# Patient Record
Sex: Male | Born: 1960 | Race: White | Hispanic: No | Marital: Married | Smoking: Never smoker
Health system: Southern US, Community
[De-identification: ages and names within clinical notes are randomized; demographics above are authoritative.]

## PROBLEM LIST (undated history)

## (undated) DIAGNOSIS — I10 Essential (primary) hypertension: Secondary | ICD-10-CM

## (undated) DIAGNOSIS — E785 Hyperlipidemia, unspecified: Secondary | ICD-10-CM

## (undated) DIAGNOSIS — E119 Type 2 diabetes mellitus without complications: Secondary | ICD-10-CM

## (undated) HISTORY — PX: APPENDECTOMY: SHX54

## (undated) HISTORY — DX: Essential (primary) hypertension: I10

## (undated) HISTORY — DX: Hyperlipidemia, unspecified: E78.5

---
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
## 1996-05-18 HISTORY — PX: VASECTOMY: SHX75

## 2016-09-19 DIAGNOSIS — S93401A Sprain of unspecified ligament of right ankle, initial encounter: Secondary | ICD-10-CM | POA: Diagnosis not present

## 2016-12-15 LAB — HEPATIC FUNCTION PANEL
ALT: 17 (ref 10–40)
AST: 14 (ref 14–40)
Alkaline Phosphatase: 87 (ref 25–125)
BILIRUBIN, TOTAL: 0.5

## 2016-12-15 LAB — BASIC METABOLIC PANEL
BUN: 11 (ref 4–21)
CREATININE: 0.8 (ref 0.6–1.3)
Glucose: 114
POTASSIUM: 4.4 (ref 3.4–5.3)
SODIUM: 140 (ref 137–147)

## 2016-12-15 LAB — LIPID PANEL
Cholesterol: 185 (ref 0–200)
HDL: 50 (ref 35–70)
LDL CALC: 107
LDL/HDL RATIO: 3.7
Triglycerides: 141 (ref 40–160)

## 2016-12-15 LAB — VITAMIN D 25 HYDROXY (VIT D DEFICIENCY, FRACTURES): Vit D, 25-Hydroxy: 21.1

## 2016-12-15 LAB — CBC AND DIFFERENTIAL
HEMATOCRIT: 41 (ref 41–53)
HEMOGLOBIN: 13.1 — AB (ref 13.5–17.5)
NEUTROS ABS: 71
PLATELETS: 248 (ref 150–399)
WBC: 6.4

## 2016-12-15 LAB — HEMOGLOBIN A1C: Hemoglobin A1C: 6.5

## 2016-12-15 LAB — PSA: PSA: 0.6

## 2016-12-15 LAB — TSH: TSH: 1.91 (ref 0.41–5.90)

## 2017-02-24 ENCOUNTER — Ambulatory Visit (INDEPENDENT_AMBULATORY_CARE_PROVIDER_SITE_OTHER): Payer: BLUE CROSS/BLUE SHIELD | Admitting: Family Medicine

## 2017-02-24 ENCOUNTER — Encounter: Payer: Self-pay | Admitting: Family Medicine

## 2017-02-24 VITALS — BP 130/92 | HR 72 | Temp 97.8°F | Ht 69.25 in | Wt 316.2 lb

## 2017-02-24 DIAGNOSIS — G4733 Obstructive sleep apnea (adult) (pediatric): Secondary | ICD-10-CM | POA: Diagnosis not present

## 2017-02-24 DIAGNOSIS — Z1211 Encounter for screening for malignant neoplasm of colon: Secondary | ICD-10-CM | POA: Diagnosis not present

## 2017-02-24 DIAGNOSIS — Z7689 Persons encountering health services in other specified circumstances: Secondary | ICD-10-CM | POA: Diagnosis not present

## 2017-02-24 DIAGNOSIS — R7989 Other specified abnormal findings of blood chemistry: Secondary | ICD-10-CM

## 2017-02-24 DIAGNOSIS — Z6841 Body Mass Index (BMI) 40.0 and over, adult: Secondary | ICD-10-CM

## 2017-02-24 DIAGNOSIS — Z9989 Dependence on other enabling machines and devices: Secondary | ICD-10-CM

## 2017-02-24 NOTE — Patient Instructions (Signed)
Follow up in 6 months   Mediterranean Diet A Mediterranean diet refers to food and lifestyle choices that are based on the traditions of countries located on the [employer]. This way of eating has been shown to help prevent certain conditions and improve outcomes for people who have chronic diseases, like kidney disease and heart disease. What are tips for following this plan? Lifestyle  Cook and eat meals together with your family, when possible.  Drink enough fluid to keep your urine clear or pale yellow.  Be physically active every day. This includes: ? Aerobic exercise like running or swimming. ? Leisure activities like gardening, walking, or housework.  Get 7-8 hours of sleep each night.  If recommended by your health care provider, drink red wine in moderation. This means 1 glass a day for nonpregnant women and 2 glasses a day for men. A glass of wine equals 5 oz (150 mL). Reading food labels  Check the serving size of packaged foods. For foods such as rice and pasta, the serving size refers to the amount of cooked product, not dry.  Check the total fat in packaged foods. Avoid foods that have saturated fat or trans fats.  Check the ingredients list for added sugars, such as corn syrup. Shopping  At the grocery store, buy most of your food from the areas near the walls of the store. This includes: ? Fresh fruits and vegetables (produce). ? Grains, beans, nuts, and seeds. Some of these may be available in unpackaged forms or large amounts (in bulk). ? Fresh seafood. ? Poultry and eggs. ? Low-fat dairy products.  Buy whole ingredients instead of prepackaged foods.  Buy fresh fruits and vegetables in-season from local farmers markets.  Buy frozen fruits and vegetables in resealable bags.  If you do not have access to quality fresh seafood, buy precooked frozen shrimp or canned fish, such as tuna, salmon, or sardines.  Buy small amounts of raw or cooked vegetables,  salads, or olives from the deli or salad bar at your store.  Stock your pantry so you always have certain foods on hand, such as olive oil, canned tuna, canned tomatoes, rice, pasta, and beans. Cooking  Cook foods with extra-virgin olive oil instead of using butter or other vegetable oils.  Have meat as a side dish, and have vegetables or grains as your main dish. This means having meat in small portions or adding small amounts of meat to foods like pasta or stew.  Use beans or vegetables instead of meat in common dishes like chili or lasagna.  Experiment with different cooking methods. Try roasting or broiling vegetables instead of steaming or sauteing them.  Add frozen vegetables to soups, stews, pasta, or rice.  Add nuts or seeds for added healthy fat at each meal. You can add these to yogurt, salads, or vegetable dishes.  Marinate fish or vegetables using olive oil, lemon juice, garlic, and fresh herbs. Meal planning  Plan to eat 1 vegetarian meal one day each week. Try to work up to 2 vegetarian meals, if possible.  Eat seafood 2 or more times a week.  Have healthy snacks readily available, such as: ? Vegetable sticks with hummus. ? [place] yogurt. ? Fruit and nut trail mix.  Eat balanced meals throughout the week. This includes: ? Fruit: 2-3 servings a day ? Vegetables: 4-5 servings a day ? Low-fat dairy: 2 servings a day ? Fish, poultry, or lean meat: 1 serving a day ? Beans and legumes: 2  or more servings a week ? Nuts and seeds: 1-2 servings a day ? Whole grains: 6-8 servings a day ? Extra-virgin olive oil: 3-4 servings a day  Limit red meat and sweets to only a few servings a month What are my food choices?  Mediterranean diet ? Recommended ? Grains: Whole-grain pasta. Brown rice. Bulgar wheat. Polenta. Couscous. Whole-wheat bread. [place]. ? Vegetables: Artichokes. Beets. Broccoli. Cabbage. Carrots. Eggplant. Green beans. Chard. Kale. Spinach. Onions.  Leeks. Peas. Squash. Tomatoes. Peppers. Radishes. ? Fruits: Apples. Apricots. Avocado. Berries. Bananas. Cherries. Dates. Figs. Grapes. Lemons. Melon. Oranges. Peaches. Plums. Pomegranate. ? Meats and other protein foods: Beans. Almonds. Sunflower seeds. Pine nuts. Peanuts. Sparks. Salmon. Scallops. Shrimp. [place]. Tilapia. Clams. Oysters. Eggs. ? Dairy: Low-fat milk. Cheese. Greek yogurt. ? Beverages: Water. Red wine. Herbal tea. ? Fats and oils: Extra virgin olive oil. Avocado oil. Grape seed oil. ? Sweets and desserts: [place] yogurt with honey. Baked apples. Poached pears. Trail mix. ? Seasoning and other foods: Basil. Cilantro. Coriander. Cumin. Mint. Parsley. Sage. Rosemary. Tarragon. Garlic. Oregano. Thyme. Pepper. Balsalmic vinegar. Tahini. Hummus. Tomato sauce. Olives. Mushrooms. ? Limit these ? Grains: Prepackaged pasta or rice dishes. Prepackaged cereal with added sugar. ? Vegetables: Deep fried potatoes (french fries). ? Fruits: Fruit canned in syrup. ? Meats and other protein foods: Beef. Pork. Lamb. Poultry with skin. Hot dogs. Berniece Salines. ? Dairy: Ice cream. Sour cream. Whole milk. ? Beverages: Juice. Sugar-sweetened soft drinks. Beer. Liquor and spirits. ? Fats and oils: Butter. Canola oil. Vegetable oil. Beef fat (tallow). Lard. ? Sweets and desserts: Cookies. Cakes. Pies. Candy. ? Seasoning and other foods: Mayonnaise. Premade sauces and marinades. ? The items listed may not be a complete list. Talk with your dietitian about what dietary choices are right for you. Summary  The Mediterranean diet includes both food and lifestyle choices.  Eat a variety of fresh fruits and vegetables, beans, nuts, seeds, and whole grains.  Limit the amount of red meat and sweets that you eat.  Talk with your health care provider about whether it is safe for you to drink red wine in moderation. This means 1 glass a day for nonpregnant women and 2 glasses a day for men. A glass of wine equals 5 oz (150  mL). This information is not intended to replace advice given to you by your health care provider. Make sure you discuss any questions you have with your health care provider. Document Released: 12/26/2015 Document Revised: 01/28/2016 Document Reviewed: 12/26/2015 Elsevier Interactive Patient Education  [employer].

## 2017-02-24 NOTE — Progress Notes (Signed)
Subjective:    Patient ID: Mike Wilson, male    DOB: 12/23/1960, 56 y.o.   MRN: [id number]  HPI This is a 56 yo male who presents today to establish care. He is married (28 years). 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 Has 4 children, 3 at home. Has been remodeling his house over last year. Stress level good. Sleeps 7-8 hours most nights, feels rested.  Had blood work done 2 months ago through work.   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 Little soda, drinks 1/2 and 1/2 tea occasionally, coffee. Lightly sweetened coffee. Weight varies from 270-340. Good water intake. Eats trail mix for snack.   Has had low testosterone and was on testosterone injections. Felt like he was able to lose weight better. Has seen urologist in past.  Uses CPAP. Good compliance. Feels rested.  No past medical history on file. Past Surgical History:  Procedure Laterality Date  . TONSILLECTOMY  1969  . VASECTOMY  1998   Family History  Problem Relation Age of Onset  . Cancer Mother   . Diabetes Mother   . Cancer Father   . Diabetes Sister    Social History  Substance Use Topics  . Smoking status: Never Smoker  . Smokeless tobacco: Never Used  . Alcohol use Yes     Comment: occ      Review of Systems  Constitutional: Negative for fever.  HENT: Negative.   Eyes: Negative.   Respiratory: Negative.   Cardiovascular: Positive for leg swelling (occasional). Negative for chest pain and palpitations.  Gastrointestinal: Negative for abdominal pain, constipation, diarrhea, nausea and vomiting.       Occasional acid indigestion, usually with  tomato based products.   Genitourinary: Negative.   Musculoskeletal:       Knee pain with a lot of activity.   Allergic/Immunologic: Negative.   Neurological: Negative.   Psychiatric/Behavioral: Negative for dysphoric mood and sleep disturbance. The patient is not nervous/anxious.    Depression screen PHQ 2/9 02/24/2017  Decreased Interest 0  Down, Depressed, Hopeless 0  PHQ - 2 Score 0       Objective:   Physical Exam  Constitutional: He is oriented to person, place, and time. He appears well-developed and well-nourished. No distress.  HENT:  Head: Normocephalic and atraumatic.  Cardiovascular: Normal rate, regular rhythm and normal heart sounds.   Pulmonary/Chest: Effort normal and breath sounds normal.  Neurological: He is alert and oriented to person, place, and time.  Skin: Skin is warm and dry. He is not diaphoretic.  Chronic appearing hyperpigmentation of left lower leg.   Psychiatric: He has a normal mood and affect. His behavior is normal. Judgment and thought content normal.  Vitals reviewed.   BP (!) 148/92 (BP Location: Left Arm, Patient Position: Sitting, Cuff Size: Large)   Pulse 72   Temp 97.8 F (36.6 C) (Oral)   Ht 5' 9.25" (1.759 m)   Wt (!) 316 lb 4 oz (143.5 kg)   SpO2 97%   BMI 46.37 kg/m     Assessment & Plan:  1. Encounter to establish care - Discussed and encouraged  healthy lifestyle choices- adequate sleep, regular exercise, stress management and healthy food choices.  - release of records obtained and will obtain recent labs  2. Class 3 severe obesity due to excess calories with serious comorbidity and body mass index (BMI) of 45.0 to 49.9 in adult [facility]) - provided information about Med Diet and encouraged meal planning, watching portions - discussed weight loss and realistic goal of 1/2-1 pound a week  3. OSA on CPAP - continue nightly use  4. Low testosterone in male - Ambulatory referral to Urology  5. Screening for colon cancer -  Ambulatory referral to Gastroenterology   Clarene Reamer, FNP-BC  [facility]  02/24/2017 1:15 PM

## 2017-03-01 LAB — HM DIABETES EYE EXAM

## 2017-03-10 ENCOUNTER — Other Ambulatory Visit: Payer: Self-pay | Admitting: Family Medicine

## 2017-03-10 ENCOUNTER — Encounter: Payer: Self-pay | Admitting: Family Medicine

## 2017-03-10 ENCOUNTER — Telehealth: Payer: Self-pay | Admitting: Family Medicine

## 2017-03-10 MED ORDER — METFORMIN HCL 500 MG PO TABS
ORAL_TABLET | ORAL | 5 refills | Status: DC
Start: 2017-03-10 — End: 2017-12-09

## 2017-03-10 NOTE — Telephone Encounter (Signed)
Called and spoke with pt informing him of results and recommendations. Understanding verbalized nothing further needed at this time. Patient will call and schedule an appointment at his convenience.

## 2017-03-10 NOTE — Telephone Encounter (Signed)
Please call patient and tell him that I received his lab results and his hemoglobin A1C level shows he has diabetes. I have sent in a prescription for a medication to help lower his blood sugar and it will also help him lose weight. I am sending additional information through [facility]. I would like him to come back in 3-4 months for a recheck.

## 2017-03-12 ENCOUNTER — Ambulatory Visit (INDEPENDENT_AMBULATORY_CARE_PROVIDER_SITE_OTHER): Payer: BLUE CROSS/BLUE SHIELD | Admitting: Urology

## 2017-03-12 ENCOUNTER — Encounter: Payer: Self-pay | Admitting: Urology

## 2017-03-12 VITALS — BP 151/90 | HR 76 | Ht 69.25 in | Wt 315.9 lb

## 2017-03-12 DIAGNOSIS — E291 Testicular hypofunction: Secondary | ICD-10-CM | POA: Diagnosis not present

## 2017-03-12 DIAGNOSIS — N401 Enlarged prostate with lower urinary tract symptoms: Secondary | ICD-10-CM | POA: Diagnosis not present

## 2017-03-12 DIAGNOSIS — Z87448 Personal history of other diseases of urinary system: Secondary | ICD-10-CM | POA: Diagnosis not present

## 2017-03-12 NOTE — Progress Notes (Signed)
03/12/2017 9:16 AM   [name] April 22, 1961 [id number]  Referring provider: Elby Beck, [place], [place] [address]  Chief Complaint  Patient presents with  . Hypogonadism    HPI: Mike Wilson is a 56 y.o. male with a prior history of hypogonadism.  He was on testosterone replacement therapy for several years when living in [place].  He last saw his urologist there 3 years ago when he moved to [place].  He lost his insurance and has been off replacement for the last 3 years.  He desires to restart therapy.  His symptoms include tiredness, fatigue, decreased libido, erectile dysfunction and increased weight gain.  He states all of the symptoms were improved on replacement.  He also has an apparent history of urethral stricture disease and His previous records have been requested.  Periodically had either urethral dilation or internal urethrotomy.  He has intermittent lower urinary tract symptoms consisting of urinary frequency and urgency which occur about once per week.  Denies dysuria or gross hematuria.  Denies flank, abdominal, pelvic or scrotal pain.   PMH: History reviewed. No pertinent past medical history.  Surgical History: Past Surgical History:  Procedure Laterality Date  . APPENDECTOMY    . TONSILLECTOMY  1969  . VASECTOMY  1998    Home Medications:  Allergies as of 03/12/2017   No Known Allergies     Medication List       Accurate as of 03/12/17  9:16 AM. Always use your most recent med list.          ibuprofen 600 MG tablet Commonly known as:  ADVIL,MOTRIN Take by mouth.   metFORMIN 500 MG tablet Commonly known as:  GLUCOPHAGE Take one tablet once a day with dinner for 7 days then take one tablet twice a day (breakfast and dinner).       Allergies: No Known Allergies  Family History: Family History  Problem Relation Age of Onset  . Cancer Mother   . Diabetes Mother   . Cancer Father   . Diabetes Sister     . Prostate cancer Neg Hx   . Bladder Cancer Neg Hx   . Kidney cancer Neg Hx     Social History:  reports that he has never smoked. He has never used smokeless tobacco. He reports that he drinks alcohol. He reports that he does not use drugs.  ROS: UROLOGY Frequent Urination?: Yes Hard to postpone urination?: Yes Burning/pain with urination?: No Get up at night to urinate?: No Leakage of urine?: Yes Urine stream starts and stops?: No Trouble starting stream?: No Do you have to strain to urinate?: No Blood in urine?: No Urinary tract infection?: No Sexually transmitted disease?: No Injury to kidneys or bladder?: No Painful intercourse?: No Weak stream?: No Erection problems?: No Penile pain?: No  Gastrointestinal Nausea?: No Vomiting?: No Indigestion/heartburn?: No Diarrhea?: Yes Constipation?: No  Constitutional Fever: No Night sweats?: No Weight loss?: Yes Fatigue?: No  Skin Skin rash/lesions?: No Itching?: No  Eyes Blurred vision?: No Double vision?: No  Ears/Nose/Throat Sore throat?: No Sinus problems?: No  Hematologic/Lymphatic Swollen glands?: No Easy bruising?: No  Cardiovascular Leg swelling?: No Chest pain?: No  Respiratory Cough?: No Shortness of breath?: No  Endocrine Excessive thirst?: No  Musculoskeletal Back pain?: No Joint pain?: No  Neurological Headaches?: No Dizziness?: No  Psychologic Depression?: No Anxiety?: No  Physical Exam: BP (!) 151/90 (BP Location: Right Arm, Patient Position: Sitting, Cuff Size: Large)  Pulse 76   Ht 5' 9.25" (1.759 m)   Wt (!) 315 lb 14.4 oz (143.3 kg)   BMI 46.31 kg/m   Constitutional:  Alert and oriented, No acute distress. HEENT: [place] AT, moist mucus membranes.  Trachea midline, no masses. Cardiovascular: No clubbing, cyanosis, or edema. Respiratory: Normal respiratory effort, no increased work of breathing. GI: Abdomen is soft, nontender, nondistended, no abdominal masses GU: No  CVA tenderness.  Penis without lesions, testes descended bilaterally without masses or tenderness, cord/epididymes palpably normal.  Prostate 35g, unable to palpate upper half secondary to body habitus. Skin: No rashes, bruises or suspicious lesions. Lymph: No cervical or inguinal adenopathy. Neurologic: Grossly intact, no focal deficits, moving all 4 extremities. Psychiatric: Normal mood and affect.   Assessment & Plan:   1. Hypogonadism male He desires to restart testosterone replacement.  He was sent to the lab for testosterone, LH and PSA. Potential side effects of testosterone replacement were discussed including stimulation of benign prostatic growth with lower urinary tract symptoms; erythrocytosis; edema; gynecomastia; worsening sleep apnea; venous thromboembolism; testicular atrophy and infertility. Recent studies suggesting an increased incidence of heart attack and stroke in patients taking testosterone was discussed. He was informed there is conflicting evidence regarding the impact of testosterone therapy on cardiovascular risk. The theoretical risk of growth stimulation of an undetected prostate cancer was also discussed.  He was informed that current evidence does not provide any definitive answers regarding the risks of testosterone therapy on prostate cancer and cardiovascular disease. The need for periodic monitoring of his testosterone level, PSA, hematocrit and DRE was discussed.  If hypogonadism confirmed Rx will be sent to his pharmacy and he will follow-up in 3 months.  - Testosterone - Luteinizing hormone  2. Benign prostatic hyperplasia with lower urinary tract symptoms, symptom details unspecified Mild lower urinary tract symptoms which are not bothersome  - PSA  3. History of urethral stricture Presently asymptomatic.  Previous urology records have been requested.   Return in about 3 months (around 06/12/2017) for Recheck, T level.  Abbie Sons,  [facility] [address], [place], [place] [address] [phone]

## 2017-03-13 LAB — TESTOSTERONE: Testosterone: 223 ng/dL — ABNORMAL LOW (ref 264–916)

## 2017-03-13 LAB — LUTEINIZING HORMONE: LH: 4.1 m[IU]/mL (ref 1.7–8.6)

## 2017-03-13 LAB — PSA: Prostate Specific Ag, Serum: 0.5 ng/mL (ref 0.0–4.0)

## 2017-03-21 ENCOUNTER — Encounter: Payer: Self-pay | Admitting: Family Medicine

## 2017-06-09 ENCOUNTER — Encounter: Payer: Self-pay | Admitting: Urology

## 2017-06-09 ENCOUNTER — Ambulatory Visit: Payer: BLUE CROSS/BLUE SHIELD | Admitting: Urology

## 2017-06-09 VITALS — BP 141/96 | HR 69 | Ht 69.25 in | Wt 318.0 lb

## 2017-06-09 DIAGNOSIS — E291 Testicular hypofunction: Secondary | ICD-10-CM

## 2017-06-09 MED ORDER — "NEEDLE (DISP) 20G X 1-1/2"" MISC": 0 refills | Status: DC

## 2017-06-09 MED ORDER — TESTOSTERONE CYPIONATE 200 MG/ML IM SOLN: 140.0000 mg | INTRAMUSCULAR | 0 refills | Status: DC

## 2017-06-09 NOTE — Progress Notes (Signed)
06/09/2017 8:54 AM   Mike Wilson 08-02-1960 [id number]  Referring provider: Elby Beck, [place], [place] [address]  Chief Complaint  Patient presents with  . Hypogonadism    HPI: 57 year old male presents for follow-up of hypogonadism.  Refer to my previous note of 03/12/2017.  His testosterone level was low at 223.  LH was normal at 4.1.  He desires to restart testosterone replacement.   PMH: History reviewed. No pertinent past medical history.  Surgical History: Past Surgical History:  Procedure Laterality Date  . APPENDECTOMY    . TONSILLECTOMY  1969  . VASECTOMY  1998    Home Medications:  Allergies as of 06/09/2017   No Known Allergies     Medication List        Accurate as of 06/09/17  8:54 AM. Always use your most recent med list.          ibuprofen 600 MG tablet Commonly known as:  ADVIL,MOTRIN Take by mouth.   metFORMIN 500 MG tablet Commonly known as:  GLUCOPHAGE Take one tablet once a day with dinner for 7 days then take one tablet twice a day (breakfast and dinner).       Allergies: No Known Allergies  Family History: Family History  Problem Relation Age of Onset  . Cancer Mother   . Diabetes Mother   . Cancer Father   . Diabetes Sister   . Prostate cancer Neg Hx   . Bladder Cancer Neg Hx   . Kidney cancer Neg Hx     Social History:  reports that  has never smoked. he has never used smokeless tobacco. He reports that he drinks alcohol. He reports that he does not use drugs.  ROS: UROLOGY Frequent Urination?: Yes Hard to postpone urination?: Yes Burning/pain with urination?: No Get up at night to urinate?: No Leakage of urine?: Yes Urine stream starts and stops?: No Trouble starting stream?: No Do you have to strain to urinate?: No Blood in urine?: No Urinary tract infection?: No Sexually transmitted disease?: No Injury to kidneys or bladder?: No Painful intercourse?: No Weak stream?:  No Erection problems?: No Penile pain?: No  Gastrointestinal Nausea?: No Vomiting?: No Indigestion/heartburn?: No Diarrhea?: Yes Constipation?: No  Constitutional Fever: No Night sweats?: No Weight loss?: No Fatigue?: No  Skin Skin rash/lesions?: No Itching?: No  Eyes Blurred vision?: No Double vision?: No  Ears/Nose/Throat Sore throat?: No Sinus problems?: No  Hematologic/Lymphatic Swollen glands?: No Easy bruising?: No  Cardiovascular Leg swelling?: No Chest pain?: No  Respiratory Cough?: No Shortness of breath?: No  Endocrine Excessive thirst?: No  Musculoskeletal Back pain?: No Joint pain?: No  Neurological Headaches?: No Dizziness?: No  Psychologic Depression?: No Anxiety?: No  Physical Exam: BP (!) 141/96 (BP Location: Right Arm, Patient Position: Sitting, Cuff Size: Large)   Pulse 69   Ht 5' 9.25" (1.759 m)   Wt (!) 318 lb (144.2 kg)   BMI 46.62 kg/m   Constitutional:  Alert and oriented, No acute distress. HEENT: [place] AT, moist mucus membranes.  Trachea midline, no masses. Cardiovascular: No clubbing, cyanosis, or edema. Respiratory: Normal respiratory effort, no increased work of breathing. GI: Abdomen is soft, nontender, nondistended, no abdominal masses GU: No CVA tenderness.  Skin: No rashes, bruises or suspicious lesions. Lymph: No cervical or inguinal adenopathy. Neurologic: Grossly intact, no focal deficits, moving all 4 extremities. Psychiatric: Normal mood and affect.  Laboratory Data: Lab Results  Component Value Date   WBC  6.4 12/15/2016   HGB 13.1 (A) 12/15/2016   HCT 41 12/15/2016   PLT 248 12/15/2016    Lab Results  Component Value Date   CREATININE 0.8 12/15/2016    Lab Results  Component Value Date   PSA1 0.5 03/12/2017    Lab Results  Component Value Date   TESTOSTERONE 223 (L) 03/12/2017    Lab Results  Component Value Date   HGBA1C 6.5 12/15/2016     Assessment & Plan:   1. Hypogonadism  male We discussed testosterone replacement including topicals, injections and recent approval of subcutaneous injection.  His wife was giving intramuscular injections and he would like to restart.  Rx was sent to his pharmacy.  Follow-up testosterone level in 6 weeks and follow-up visit in 4 months for monitoring blood work.  Potential side effects of testosterone replacement were discussed including stimulation of benign prostatic growth with lower urinary tract symptoms; erythrocytosis; edema; gynecomastia; worsening sleep apnea; venous thromboembolism; testicular atrophy and infertility. Recent studies suggesting an increased incidence of heart attack and stroke in patients taking testosterone was discussed. He was informed there is conflicting evidence regarding the impact of testosterone therapy on cardiovascular risk. The theoretical risk of growth stimulation of an undetected prostate cancer was also discussed.  He was informed that current evidence does not provide any definitive answers regarding the risks of testosterone therapy on prostate cancer and cardiovascular disease. The need for periodic monitoring of his testosterone level, PSA, hematocrit and DRE was discussed.  - Testosterone   Return in about 4 months (around 10/07/2017).  Abbie Sons, [place] [address] [place], [place] [address] [phone]

## 2017-06-10 ENCOUNTER — Telehealth: Payer: Self-pay

## 2017-06-10 LAB — TESTOSTERONE: TESTOSTERONE: 224 ng/dL — AB (ref 264–916)

## 2017-06-10 NOTE — Telephone Encounter (Signed)
LMOM

## 2017-06-10 NOTE — Telephone Encounter (Signed)
-----   Message from Abbie Sons, MD sent at 06/10/2017  7:49 AM EST ----- Repeat T level low at 224.  Rx has been faxed.  Repeat T level 4-6 weeks

## 2017-06-11 ENCOUNTER — Encounter: Payer: Self-pay | Admitting: *Deleted

## 2017-06-11 NOTE — Telephone Encounter (Signed)
Not able to get in touch with pt. Letter sent and lab appt made.

## 2017-06-21 ENCOUNTER — Other Ambulatory Visit: Payer: Self-pay

## 2017-06-21 ENCOUNTER — Telehealth: Payer: Self-pay | Admitting: Gastroenterology

## 2017-06-21 DIAGNOSIS — Z1211 Encounter for screening for malignant neoplasm of colon: Secondary | ICD-10-CM

## 2017-06-21 NOTE — Telephone Encounter (Signed)
Gastroenterology Pre-Procedure Review  Request Date: 08/06/17 Requesting Physician: Dr. Vicente Males  PATIENT REVIEW QUESTIONS: The patient responded to the following health history questions as indicated:    1. Are you having any GI issues? no 2. Do you have a personal history of Polyps? yes (3 years ago) 3. Do you have a family history of Colon Cancer or Polyps? no 4. Diabetes Mellitus? yes (oral meds metformin) 5. Joint replacements in the past 12 months?no 6. Major health problems in the past 3 months?no 7. Any artificial heart valves, MVP, or defibrillator?no    MEDICATIONS & ALLERGIES:    Patient reports the following regarding taking any anticoagulation/antiplatelet therapy:   Plavix, Coumadin, Eliquis, Xarelto, Lovenox, Pradaxa, Brilinta, or Effient? no Aspirin? no  Patient confirms/reports the following medications:  Current Outpatient Medications  Medication Sig Dispense Refill  . ibuprofen (ADVIL,MOTRIN) 600 MG tablet Take by mouth.    . metFORMIN (GLUCOPHAGE) 500 MG tablet Take one tablet once a day with dinner for 7 days then take one tablet twice a day (breakfast and dinner). 60 tablet 5  . NEEDLE, DISP, 20 G (BD DISP NEEDLES) 20G X 1-1/2" MISC As directed 25 each 0  . testosterone cypionate (DEPOTESTOSTERONE CYPIONATE) 200 MG/ML injection Inject 0.7 mLs (140 mg total) into the muscle once a week. 10 mL 0   No current facility-administered medications for this visit.     Patient confirms/reports the following allergies:  No Known Allergies  No orders of the defined types were placed in this encounter.   AUTHORIZATION INFORMATION Primary Insurance: 1D#: Group #:  Secondary Insurance: 1D#: Group #:  SCHEDULE INFORMATION: Date: 08/06/17 Time: Location:[facility]

## 2017-06-21 NOTE — Telephone Encounter (Signed)
Please call patient to schedule colonoscopy °

## 2017-07-21 ENCOUNTER — Other Ambulatory Visit: Payer: BLUE CROSS/BLUE SHIELD

## 2017-07-21 DIAGNOSIS — E291 Testicular hypofunction: Secondary | ICD-10-CM

## 2017-07-22 LAB — TESTOSTERONE: Testosterone: 426 ng/dL (ref 264–916)

## 2017-08-06 ENCOUNTER — Encounter: Payer: Self-pay | Admitting: *Deleted

## 2017-08-06 ENCOUNTER — Ambulatory Visit
Admission: RE | Admit: 2017-08-06 | Discharge: 2017-08-06 | Disposition: A | Discharge status: Home / Self Care | Payer: BLUE CROSS/BLUE SHIELD | Source: Ambulatory Visit | Attending: Gastroenterology | Admitting: Gastroenterology

## 2017-08-06 ENCOUNTER — Encounter
Admission: RE | Disposition: A | Discharge status: Home / Self Care | Payer: Self-pay | Source: Ambulatory Visit | Attending: Gastroenterology

## 2017-08-06 ENCOUNTER — Ambulatory Visit: Payer: BLUE CROSS/BLUE SHIELD | Admitting: Certified Registered Nurse Anesthetist

## 2017-08-06 DIAGNOSIS — K640 First degree hemorrhoids: Secondary | ICD-10-CM | POA: Insufficient documentation

## 2017-08-06 DIAGNOSIS — Z79899 Other long term (current) drug therapy: Secondary | ICD-10-CM | POA: Insufficient documentation

## 2017-08-06 DIAGNOSIS — Z809 Family history of malignant neoplasm, unspecified: Secondary | ICD-10-CM | POA: Diagnosis not present

## 2017-08-06 DIAGNOSIS — K219 Gastro-esophageal reflux disease without esophagitis: Secondary | ICD-10-CM | POA: Insufficient documentation

## 2017-08-06 DIAGNOSIS — K649 Unspecified hemorrhoids: Secondary | ICD-10-CM | POA: Diagnosis not present

## 2017-08-06 DIAGNOSIS — Z7984 Long term (current) use of oral hypoglycemic drugs: Secondary | ICD-10-CM | POA: Diagnosis not present

## 2017-08-06 DIAGNOSIS — Z86010 Personal history of colonic polyps: Secondary | ICD-10-CM | POA: Insufficient documentation

## 2017-08-06 DIAGNOSIS — Z1211 Encounter for screening for malignant neoplasm of colon: Secondary | ICD-10-CM | POA: Diagnosis not present

## 2017-08-06 DIAGNOSIS — Z6841 Body Mass Index (BMI) 40.0 and over, adult: Secondary | ICD-10-CM | POA: Diagnosis not present

## 2017-08-06 DIAGNOSIS — Z833 Family history of diabetes mellitus: Secondary | ICD-10-CM | POA: Insufficient documentation

## 2017-08-06 DIAGNOSIS — D122 Benign neoplasm of ascending colon: Secondary | ICD-10-CM

## 2017-08-06 DIAGNOSIS — K635 Polyp of colon: Secondary | ICD-10-CM | POA: Diagnosis not present

## 2017-08-06 DIAGNOSIS — D126 Benign neoplasm of colon, unspecified: Secondary | ICD-10-CM | POA: Insufficient documentation

## 2017-08-06 DIAGNOSIS — E119 Type 2 diabetes mellitus without complications: Secondary | ICD-10-CM | POA: Insufficient documentation

## 2017-08-06 HISTORY — DX: Type 2 diabetes mellitus without complications: E11.9

## 2017-08-06 HISTORY — PX: COLONOSCOPY WITH PROPOFOL: SHX5780

## 2017-08-06 LAB — GLUCOSE, CAPILLARY: Glucose-Capillary: 97 mg/dL (ref 65–99)

## 2017-08-06 SURGERY — COLONOSCOPY WITH PROPOFOL
Anesthesia: General

## 2017-08-06 MED ORDER — LIDOCAINE HCL (CARDIAC) 20 MG/ML IV SOLN
INTRAVENOUS | Status: DC | PRN
  Administered 2017-08-06: 50 mg via INTRAVENOUS

## 2017-08-06 MED ORDER — PROPOFOL 10 MG/ML IV BOLUS
INTRAVENOUS | Status: DC | PRN
  Administered 2017-08-06 (×2): 30 mg via INTRAVENOUS
  Administered 2017-08-06: 20 mg via INTRAVENOUS
  Administered 2017-08-06 (×2): 30 mg via INTRAVENOUS
  Administered 2017-08-06: 20 mg via INTRAVENOUS
  Administered 2017-08-06 (×2): 30 mg via INTRAVENOUS
  Administered 2017-08-06: 100 mg via INTRAVENOUS
  Administered 2017-08-06: 20 mg via INTRAVENOUS
  Administered 2017-08-06: 30 mg via INTRAVENOUS
  Administered 2017-08-06: 20 mg via INTRAVENOUS

## 2017-08-06 MED ORDER — PROPOFOL 500 MG/50ML IV EMUL
INTRAVENOUS | Status: AC
  Filled 2017-08-06: qty 50

## 2017-08-06 MED ORDER — LIDOCAINE HCL (PF) 2 % IJ SOLN
INTRAMUSCULAR | Status: AC
  Filled 2017-08-06: qty 10

## 2017-08-06 MED ORDER — SODIUM CHLORIDE 0.9 % IV SOLN
INTRAVENOUS | Status: DC
  Administered 2017-08-06: 09:00:00 via INTRAVENOUS

## 2017-08-06 NOTE — Anesthesia Postprocedure Evaluation (Signed)
Anesthesia Post Note  Patient: Mike Wilson  Procedure(s) Performed: COLONOSCOPY WITH PROPOFOL (N/A )  Patient location during evaluation: Endoscopy Anesthesia Type: General Level of consciousness: awake and alert Pain management: pain level controlled Vital Signs Assessment: post-procedure vital signs reviewed and stable Respiratory status: spontaneous breathing, nonlabored ventilation, respiratory function stable and patient connected to nasal cannula oxygen Cardiovascular status: blood pressure returned to baseline and stable Postop Assessment: no apparent nausea or vomiting Anesthetic complications: no     Last Vitals:  Vitals Value Taken Time  BP    Temp    Pulse    Resp    SpO2      Last Pain:  Vitals:   08/06/17 0920  TempSrc: Tympanic  PainSc:                  [name]

## 2017-08-06 NOTE — Anesthesia Post-op Follow-up Note (Signed)
Anesthesia QCDR form completed.        

## 2017-08-06 NOTE — Op Note (Signed)
[facility] Gastroenterology Patient Name: Mike Wilson Procedure Date: 08/06/2017 8:34 AM MRN: [id number] Account #: [id number] Date of Birth: 04/30/1961 Admit Type: Outpatient Age: 57 Room: [facility] ENDO ROOM 4 Gender: Male Note Status: Finalized Procedure:            Colonoscopy Indications:          High risk colon cancer surveillance: Personal history                        of colonic polyps Providers:            Jonathon Bellows MD, MD Referring MD:         Elby Beck (Referring MD) Medicines:            Monitored Anesthesia Care Complications:        No immediate complications. Procedure:            Pre-Anesthesia Assessment:                       - Prior to the procedure, a History and Physical was                        performed, and patient medications, allergies and                        sensitivities were reviewed. The patient's tolerance of                        previous anesthesia was reviewed.                       - The risks and benefits of the procedure and the                        sedation options and risks were discussed with the                        patient. All questions were answered and informed                        consent was obtained.                       - ASA Grade Assessment: III - A patient with severe                        systemic disease.                       After obtaining informed consent, the colonoscope was                        passed under direct vision. Throughout the procedure,                        the patient's blood pressure, pulse, and oxygen                        saturations were monitored continuously. The                        Colonoscope  was introduced through the anus and                        advanced to the the cecum, identified by the                        appendiceal orifice, IC valve and transillumination.                        The colonoscopy was performed with ease. The patient               tolerated the procedure well. The quality of the bowel                        preparation was good. Findings:      The perianal and digital rectal examinations were normal.      A 3 mm polyp was found in the ascending colon. The polyp was sessile.       The polyp was removed with a cold biopsy forceps. Resection and       retrieval were complete.      Non-bleeding internal hemorrhoids were found during retroflexion. The       hemorrhoids were small and Grade I (internal hemorrhoids that do not       prolapse).      The exam was otherwise without abnormality on direct and retroflexion       views. Impression:           - One 3 mm polyp in the ascending colon, removed with a                        cold biopsy forceps. Resected and retrieved.                       - Non-bleeding internal hemorrhoids.                       - The examination was otherwise normal on direct and                        retroflexion views. Recommendation:       - Discharge patient to home (with escort).                       - Resume previous diet.                       - Continue present medications.                       - Await pathology results.                       - Repeat colonoscopy in 5 years for surveillance. Procedure Code(s):    --- Professional ---                       [id number], Colonoscopy, flexible; with biopsy, single or                        multiple Diagnosis Code(s):    --- Professional ---  D12.2, Benign neoplasm of ascending colon                       Z86.010, Personal history of colonic polyps                       K64.0, First degree hemorrhoids CPT copyright 2016 American Medical Association. All rights reserved. The codes documented in this report are preliminary and upon coder review may  be revised to meet current compliance requirements. Jonathon Bellows, MD Jonathon Bellows MD, MD 08/06/2017 9:21:00 AM This report has been signed electronically. Number of  Addenda: 0 Note Initiated On: 08/06/2017 8:34 AM Scope Withdrawal Time: 0 hours 11 minutes 18 seconds  Total Procedure Duration: 0 hours 14 minutes 27 seconds       [facility]

## 2017-08-06 NOTE — Transfer of Care (Signed)
Immediate Anesthesia Transfer of Care Note  Patient: Mike Wilson  Procedure(s) Performed: COLONOSCOPY WITH PROPOFOL (N/A )  Patient Location: PACU  Anesthesia Type:General  Level of Consciousness: drowsy  Airway & Oxygen Therapy: Patient Spontanous Breathing and Patient connected to nasal cannula oxygen  Post-op Assessment: Report given to RN and Post -op Vital signs reviewed and stable  Post vital signs: Reviewed and stable  Last Vitals:  Vitals Value Taken Time  BP 108/68 08/06/2017  9:24 AM  Temp 35.9 C 08/06/2017  9:20 AM  Pulse 65 08/06/2017  9:25 AM  Resp 15 08/06/2017  9:25 AM  SpO2 96 % 08/06/2017  9:25 AM  Vitals shown include unvalidated device data.  Last Pain:  Vitals:   08/06/17 0920  TempSrc: Tympanic  PainSc:          Complications: No apparent anesthesia complications

## 2017-08-06 NOTE — H&P (Signed)
Jonathon Bellows, MD [address], [place], [place], [place], [address] [address], [place], [place], [place], [address] Phone: [phone]  Fax: [phone]  Primary Care Physician:  Elby Beck, FNP   Pre-Procedure History & Physical: HPI:  Amine Adelson is a 57 y.o. male is here for an colonoscopy.   Past Medical History:  Diagnosis Date  . Diabetes mellitus without complication [facility])     Past Surgical History:  Procedure Laterality Date  . APPENDECTOMY    . TONSILLECTOMY  1969  . VASECTOMY  1998    Prior to Admission medications   Medication Sig Start Date End Date Taking? Authorizing Provider  metFORMIN (GLUCOPHAGE) 500 MG tablet Take one tablet once a day with dinner for 7 days then take one tablet twice a day (breakfast and dinner). 03/10/17  Yes Elby Beck, FNP  testosterone cypionate (DEPOTESTOSTERONE CYPIONATE) 200 MG/ML injection Inject 0.7 mLs (140 mg total) into the muscle once a week. 06/09/17  Yes Stoioff, Ronda Fairly, MD  ibuprofen (ADVIL,MOTRIN) 600 MG tablet Take by mouth.    [provider]  NEEDLE, DISP, 20 G (BD DISP NEEDLES) 20G X 1-1/2" MISC As directed 06/09/17   Abbie Sons, MD    Allergies as of 06/22/2017  . (No Known Allergies)    Family History  Problem Relation Age of Onset  . Cancer Mother   . Diabetes Mother   . Cancer Father   . Diabetes Sister   . Prostate cancer Neg Hx   . Bladder Cancer Neg Hx   . Kidney cancer Neg Hx     Social History   Socioeconomic History  . Marital status: Married    Spouse name: Not on file  . Number of children: Not on file  . Years of education: Not on file  . Highest education level: Not on file  Occupational History  . Not on file  Social Needs  . Financial resource strain: Not on file  . Food insecurity:    Worry: Not on file    Inability: Not on file  . Transportation needs:    Medical: Not on file    Non-medical: Not on file  Tobacco Use  . Smoking status: Never  Smoker  . Smokeless tobacco: Never Used  Substance and Sexual Activity  . Alcohol use: Yes    Comment: occ  . Drug use: No  . Sexual activity: Yes    Partners: Female  Lifestyle  . Physical activity:    Days per week: Not on file    Minutes per session: Not on file  . Stress: Not on file  Relationships  . Social connections:    Talks on phone: Not on file    Gets together: Not on file    Attends religious service: Not on file    Active member of club or organization: Not on file    Attends meetings of clubs or organizations: Not on file    Relationship status: Not on file  . Intimate partner violence:    Fear of current or ex partner: Not on file    Emotionally abused: Not on file    Physically abused: Not on file    Forced sexual activity: Not on file  Other Topics Concern  . Not on file  Social History Narrative  . Not on file    Review of Systems: See HPI, otherwise negative ROS  Physical Exam: BP 120/74   Pulse 60   Temp 98.1 F (  36.7 C) (Tympanic)   Resp 16   Ht 5\' 9"  (1.753 m)   Wt (!) 318 lb (144.2 kg)   SpO2 100%   BMI 46.96 kg/m  General:   Alert,  pleasant and cooperative in NAD Head:  Normocephalic and atraumatic. Neck:  Supple; no masses or thyromegaly. Lungs:  Clear throughout to auscultation, normal respiratory effort.    Heart:  +S1, +S2, Regular rate and rhythm, No edema. Abdomen:  Soft, nontender and nondistended. Normal bowel sounds, without guarding, and without rebound.   Neurologic:  Alert and  oriented x4;  grossly normal neurologically.  Impression/Plan: Jatniel Verastegui is here for an colonoscopy to be performed for surveillance due to prior history of colon polyps   Risks, benefits, limitations, and alternatives regarding  colonoscopy have been reviewed with the patient.  Questions have been answered.  All parties agreeable.   Jonathon Bellows, MD  08/06/2017, 8:35 AM

## 2017-08-06 NOTE — Anesthesia Preprocedure Evaluation (Signed)
Anesthesia Evaluation  Patient identified by MRN, date of birth, ID band Patient awake    Reviewed: Allergy & Precautions, H&P , NPO status , Patient's Chart, lab work & pertinent test results, reviewed documented beta blocker date and time   History of Anesthesia Complications Negative for: history of anesthetic complications  Airway Mallampati: I  TM Distance: >3 FB Neck ROM: full    Dental  (+) Missing, Dental Advidsory Given   Pulmonary neg pulmonary ROS,           Cardiovascular Exercise Tolerance: Good negative cardio ROS       Neuro/Psych negative neurological ROS  negative psych ROS   GI/Hepatic Neg liver ROS, GERD  ,  Endo/Other  diabetes, Type 2, Oral Hypoglycemic AgentsMorbid obesity  Renal/GU negative Renal ROS  negative genitourinary   Musculoskeletal   Abdominal   Peds  Hematology negative hematology ROS (+)   Anesthesia Other Findings Past Medical History: No date: Diabetes mellitus without complication (HCC)   Reproductive/Obstetrics negative OB ROS                             Anesthesia Physical Anesthesia Plan  ASA: III  Anesthesia Plan: General   Post-op Pain Management:    Induction: Intravenous  PONV Risk Score and Plan: 2 and Propofol infusion  Airway Management Planned: Natural Airway and Nasal Cannula  Additional Equipment:   Intra-op Plan:   Post-operative Plan:   Informed Consent: I have reviewed the patients History and Physical, chart, labs and discussed the procedure including the risks, benefits and alternatives for the proposed anesthesia with the patient or authorized representative who has indicated his/her understanding and acceptance.   Dental Advisory Given  Plan Discussed with: Anesthesiologist, CRNA and Surgeon  Anesthesia Plan Comments:         Anesthesia Quick Evaluation

## 2017-08-06 NOTE — Anesthesia Procedure Notes (Signed)
Date/Time: 08/06/2017 9:00 AM Performed by: Rudean Hitt, CRNA Pre-anesthesia Checklist: Patient identified, Emergency Drugs available, Suction available, Patient being monitored and Timeout performed Patient Re-evaluated:Patient Re-evaluated prior to induction Oxygen Delivery Method: Nasal cannula

## 2017-08-09 ENCOUNTER — Encounter: Payer: Self-pay | Admitting: Gastroenterology

## 2017-08-09 LAB — SURGICAL PATHOLOGY

## 2017-08-15 ENCOUNTER — Encounter: Payer: Self-pay | Admitting: Gastroenterology

## 2017-08-16 ENCOUNTER — Encounter: Payer: Self-pay | Admitting: Family Medicine

## 2017-08-18 ENCOUNTER — Telehealth: Payer: Self-pay

## 2017-08-18 NOTE — Telephone Encounter (Signed)
Please call patient and let him know that I am happy to order hemoglobin a1c at next visit.

## 2017-08-18 NOTE — Telephone Encounter (Signed)
Copied from [place]. Topic: Inquiry >> Aug 18, 2017  1:33 PM Pricilla Handler wrote: Reason for CRM: Patient wants to know if [employer] would order a A1C Testing during patient's upcoming appt. Patient stated that he needs this test for his job. Patient would like [employer] to call him at [phone].       Thank You!!!

## 2017-08-18 NOTE — Telephone Encounter (Signed)
Lm on pts vm and advised per [employer]

## 2017-08-18 NOTE — Telephone Encounter (Signed)
Pt has 6 mth f/u 30' appt on 08/25/17 at 8 AM with Glenda Chroman FNP. pts last A1c was 6.5 done on 12/15/16.

## 2017-08-25 ENCOUNTER — Encounter: Payer: Self-pay | Admitting: Family Medicine

## 2017-08-25 ENCOUNTER — Ambulatory Visit: Payer: BLUE CROSS/BLUE SHIELD | Admitting: Family Medicine

## 2017-08-25 VITALS — BP 122/70 | HR 64 | Temp 97.8°F | Wt 302.8 lb

## 2017-08-25 DIAGNOSIS — Z9989 Dependence on other enabling machines and devices: Secondary | ICD-10-CM

## 2017-08-25 DIAGNOSIS — G8929 Other chronic pain: Secondary | ICD-10-CM | POA: Diagnosis not present

## 2017-08-25 DIAGNOSIS — M25561 Pain in right knee: Secondary | ICD-10-CM | POA: Diagnosis not present

## 2017-08-25 DIAGNOSIS — E119 Type 2 diabetes mellitus without complications: Secondary | ICD-10-CM

## 2017-08-25 DIAGNOSIS — Z6841 Body Mass Index (BMI) 40.0 and over, adult: Secondary | ICD-10-CM | POA: Diagnosis not present

## 2017-08-25 DIAGNOSIS — G4733 Obstructive sleep apnea (adult) (pediatric): Secondary | ICD-10-CM | POA: Diagnosis not present

## 2017-08-25 DIAGNOSIS — E785 Hyperlipidemia, unspecified: Secondary | ICD-10-CM

## 2017-08-25 DIAGNOSIS — E1169 Type 2 diabetes mellitus with other specified complication: Secondary | ICD-10-CM

## 2017-08-25 DIAGNOSIS — M720 Palmar fascial fibromatosis [Dupuytren]: Secondary | ICD-10-CM | POA: Diagnosis not present

## 2017-08-25 LAB — LIPID PANEL
CHOLESTEROL: 176 mg/dL (ref 0–200)
HDL: 45.8 mg/dL (ref >39.00)
LDL Cholesterol: 109 mg/dL — ABNORMAL HIGH (ref 0–99)
NonHDL: 130.17
Total CHOL/HDL Ratio: 4
Triglycerides: 105 mg/dL (ref 0.0–149.0)
VLDL: 21 mg/dL (ref 0.0–40.0)

## 2017-08-25 LAB — MICROALBUMIN / CREATININE URINE RATIO
Creatinine,U: 135.7 mg/dL
Microalb Creat Ratio: 0.5 mg/g (ref 0.0–30.0)

## 2017-08-25 LAB — POCT GLYCOSYLATED HEMOGLOBIN (HGB A1C): HEMOGLOBIN A1C: 6

## 2017-08-25 LAB — GLUCOSE, POCT (MANUAL RESULT ENTRY): POC Glucose: 116 mg/dl — AB (ref 70–99)

## 2017-08-25 NOTE — Patient Instructions (Addendum)
Please schedule an appointment with Dr. Lorelei Pont for your knee  Follow up in 6 months for CPE Dupuytren Contracture Dupuytren contracture is a condition in which tissue under the skin of the palm becomes abnormally thickened. This causes one or more of the fingers to curl inward (contract) toward the palm. Eventually, the fingers may not be able to straighten out. This condition affects some or all of the fingers and the palm of the hand. It is often passed along from parent to child (inherited). Dupuytren contracture is a long-term (chronic) condition that develops (progresses) slowly over time. There is no cure, but symptoms can be managed and progression can be slowed with treatment. This condition is usually not dangerous or painful, but it can interfere with everyday tasks. What are the causes? This condition is caused by tissue (fascia) in the palm getting thicker and tighter. When the fascia thickens, it pulls on the cords of tissue (tendons) that control finger movement. This causes the fingers to contract. The cause of fascia thickening is not known. What increases the risk? This condition may be more likely to develop in:  People who are age 51 or older.  Men.  People with a family history of this condition.  People who use tobacco products, including cigarettes, chewing tobacco, and e-cigarettes.  People who drink alcohol excessively.  People with diabetes.  People with autoimmune diseases, such as HIV.  People with seizure disorders.  What are the signs or symptoms? Symptoms may develop in one or both hands. Any of the fingers can contract. The fingers farthest from the thumb are commonly affected. Usually, this condition is painless. You may have discomfort when holding or grabbing objects. Early symptoms of this condition may include:  Thick, puckered skin on the hand.  One or more lumps (nodules) on the palm. Nodules may be tender when they first appear, but they are  generally painless.  Symptoms of this condition develop slowly over months or years. Later symptoms of this condition may include:  Thick cords of tissue in the palm.  Fingers curled up toward the palm.  Inability to straighten the fingers into their normal position.  How is this diagnosed? This condition is diagnosed with a physical exam, which may include:  Looking at your hands and feeling your hands. This is to check for thickened fascia and nodules.  Measuring finger motion.  Doing the [employer]. You may be asked to try to put your hand on a surface, with your palm down and your fingers straight out.  How is this treated? There is no cure for this condition, but treatment can make symptoms more manageable and relieve discomfort. Treatment options may include:  Physical therapy. This can strengthen your hand and increase flexibility.  Occupational therapy. This can help you with everyday tasks that may be more difficult because of your condition.  A hand splint.  Shots (injections). Substances may be injected into your hand, such as: ? Medicines that help to decrease swelling (corticosteroids). ? Proteins (collagenase) to weaken thick tissue. After a collagenase injection, your health care provider may stretch your fingers.  Needle aponeurotomy. In this procedure, a needle is pushed through the skin and into the fascia. Moving the needle against the fascia can weaken or break up the thick tissue.  Surgery. This may be needed if your condition causes discomfort or interferes with everyday activities. Physical therapy is usually needed after surgery.  In some cases, symptoms never develop to the point of  needing major treatment, and caring for yourself at home can be enough to manage your condition. Symptoms often return after treatment. Follow these instructions at home: If you have a splint:  Do not put pressure on any part of the splint until it is fully  hardened. This may take several hours.  Wear the splint as told by your health care provider. Remove it only as told by your health care provider.  Loosen the splint if your fingers tingle, become numb, or turn cold and blue.  Do not let your splint get wet if it is not waterproof. ? If your splint is not waterproof, cover it with a watertight covering when you take a bath or a shower. ? Do not take baths, swim, or use a hot tub until your health care provider approves. Ask your health care provider if you can take showers. You may only be allowed to take sponge baths for bathing.  Keep the splint clean.  Ask your health care provider when it is safe to drive. Hand Care  Take these actions to help protect your hand from possible injury: ? Use tools that have padded grips. ? Wear protective gloves while you work with your hands. ? Avoid repetitive hand movements.  Avoid actions that cause pain or discomfort.  Stretch your hand by gently pulling your fingers backward toward your wrist. Do this as often as is comfortable. Stop if this causes pain.  Gently massage your hand as often as is comfortable.  If directed, apply heat to the affected area as often as told by your health care provider. Use the heat source that your health care provider recommends, such as a moist heat pack or a heating pad. ? Place a towel between your skin and the heat source. ? Leave the heat on for 20-30 minutes. ? Remove the heat if your skin turns bright red. This is especially important if you are unable to feel pain, heat, or cold. You may have a greater risk of getting burned. General instructions  Take over-the-counter and prescription medicines only as told by your health care provider.  Manage any other conditions that you have, such as diabetes.  If physical therapy was prescribed, do exercises as told by your health care provider.  Keep all follow-up visits as told by your health care provider.  This is important. Contact a health care provider if:  You develop new symptoms, or your symptoms get worse.  You have pain that gets worse or does not get better with medicine.  You have difficulty or discomfort with everyday tasks.  You have problems with your splint.  You develop numbness or tingling. Get help right away if:  You have severe pain.  Your fingers change color or become unusually cold. This information is not intended to replace advice given to you by your health care provider. Make sure you discuss any questions you have with your health care provider. Document Released: 03/01/2009 Document Revised: 06/18/2015 Document Reviewed: 09/26/2014 Elsevier Interactive Patient Education  [employer].

## 2017-08-25 NOTE — Progress Notes (Signed)
Subjective:    Patient ID: Mike Wilson, male    DOB: 11-07-1960, 57 y.o.   MRN: [id number]  HPI This is a 57 yo male who presents today for follow up of elevated blood sugar, obesity and sleep apnea.  He has been doing well. Staying really busy renovating his house (almost finished) and just started working on his daughter's house.   Right knee pain- has been hurting for a couple of months. No falls. Pain as day goes on, sometimes feels like it is going to give out. No pain with driving. Has been taking Relief Factor for several weeks and thinks it has been helping. Several years ago went to PT and had injection of knee with good results.   DM/obesity- has lost 16 pounds in last 3 months. Tolerating metformin.   OSA- machine not working well, over 37 years old, he requests referral to specialist for possible retesting and equipment.   Left hand callous- has noticed over several months, palmer surface, not painful, no loss of function of hand.   Past Medical History:  Diagnosis Date  . Diabetes mellitus without complication [facility])    Past Surgical History:  Procedure Laterality Date  . APPENDECTOMY    . COLONOSCOPY WITH PROPOFOL N/A 08/06/2017   Procedure: COLONOSCOPY WITH PROPOFOL;  Surgeon: Jonathon Bellows, MD;  Location: [facility] ENDOSCOPY;  Service: Gastroenterology;  Laterality: N/A;  . TONSILLECTOMY  1969  . VASECTOMY  1998   Family History  Problem Relation Age of Onset  . Cancer Mother   . Diabetes Mother   . Cancer Father   . Diabetes Sister   . Prostate cancer Neg Hx   . Bladder Cancer Neg Hx   . Kidney cancer Neg Hx    Social History   Tobacco Use  . Smoking status: Never Smoker  . Smokeless tobacco: Never Used  Substance Use Topics  . Alcohol use: Yes    Comment: occ  . Drug use: No      Review of Systems Per HPI    Objective:   Physical Exam  Constitutional: He is oriented to person, place, and time. He appears well-developed and well-nourished. No distress.   Obese.   HENT:  Head: Normocephalic and atraumatic.  Eyes: Conjunctivae are normal.  Cardiovascular: Normal rate.  Pulmonary/Chest: Effort normal.  Musculoskeletal:       Left hand: He exhibits deformity (palmar surface with palpable nodules. No pain. ). He exhibits normal range of motion and no tenderness.  Neurological: He is alert and oriented to person, place, and time.  Skin: Skin is warm and dry. He is not diaphoretic.  Psychiatric: He has a normal mood and affect. His behavior is normal. Judgment and thought content normal.  Vitals reviewed.     BP 122/70   Pulse 64   Temp 97.8 F (36.6 C) (Oral)   Wt (!) 302 lb 12 oz (137.3 kg)   SpO2 97%   BMI 44.71 kg/m  Wt Readings from Last 3 Encounters:  08/25/17 (!) 302 lb 12 oz (137.3 kg)  08/06/17 (!) 318 lb (144.2 kg)  06/09/17 (!) 318 lb (144.2 kg)   POCT hemoglobin A1C- 6.0 Fasting POCT glucose- 111    Assessment & Plan:  1. Controlled type 2 diabetes mellitus without complication, without long-term current use of insulin (HCC) - improved hemoglobin A1C - Lipid Panel - Microalbumin / creatinine urine ratio - POCT glucose (manual entry) - HgB A1c  2. Class 3 severe obesity due to excess  calories with serious comorbidity and body mass index (BMI) of 45.0 to 49.9 in adult [facility]) - encouraged continued weight loss - Lipid Panel  3. OSA on CPAP - Ambulatory referral to Pulmonology  4. Chronic pain of right knee - discussed heat/ice, bracing, suggested follow up with Dr. Lorelei Pont  5. Dupuytren's disease of palm of left hand - currently without pain, provided written and verbal information about diagnosis. Follow up with hand surgeon if pain or worsening symptoms develop.   - follow up in 6 months  Clarene Reamer, FNP-BC  [facility]  08/25/2017 2:06 PM

## 2017-08-27 MED ORDER — PRAVASTATIN SODIUM 20 MG PO TABS: 20.0000 mg | ORAL_TABLET | Freq: Every day | ORAL | 3 refills | Status: DC

## 2017-08-27 NOTE — Addendum Note (Signed)
Addended by: Clarene Reamer B on: 08/27/2017 01:57 PM   Modules accepted: Orders

## 2017-09-02 ENCOUNTER — Ambulatory Visit (INDEPENDENT_AMBULATORY_CARE_PROVIDER_SITE_OTHER): Payer: BLUE CROSS/BLUE SHIELD | Admitting: Internal Medicine

## 2017-09-02 ENCOUNTER — Encounter: Payer: Self-pay | Admitting: Internal Medicine

## 2017-09-02 VITALS — BP 126/86 | HR 42 | Resp 16 | Ht 72.0 in | Wt 299.0 lb

## 2017-09-02 DIAGNOSIS — G4733 Obstructive sleep apnea (adult) (pediatric): Secondary | ICD-10-CM

## 2017-09-02 NOTE — Patient Instructions (Signed)
Will need to obtain results from your last sleep study and will send you for a new sleep study.     Sleep Apnea       Sleep apnea is disorder that affects a person's sleep. A person with sleep apnea has abnormal pauses in their breathing when they sleep. It is hard for them to get a good sleep. This makes a person tired during the day. It also can lead to other physical problems. There are three types of sleep apnea. One type is when breathing stops for a short time because your airway is blocked (obstructive sleep apnea). Another type is when the brain sometimes fails to give the normal signal to breathe to the muscles that control your breathing (central sleep apnea). The third type is a combination of the other two types. HOME CARE   Take all medicine as told by your doctor.  Avoid alcohol, calming medicines (sedatives), and depressant drugs.  Try to lose weight if you are overweight. Talk to your doctor about a healthy weight goal.  Your doctor may have you use a device that helps to open your airway. It can help you get the air that you need. It is called a positive airway pressure (PAP) device.   MAKE SURE YOU:   Understand these instructions.  Will watch your condition.  Will get help right away if you are not doing well or get worse.  It may take approximately 1 month for you to get used to wearing her CPAP every night.  Be sure to work with your machine to get used to it, be patient, it may take time!

## 2017-09-02 NOTE — Progress Notes (Signed)
Mike Mike      Assessment and Plan:  Obstructive sleep apnea. -Patient is doing well with his CPAP, however notes that his CPAP is now making loud noises and is malfunctioning. -Unable to perform a download on his machine as card reader says "no data". -In order to qualify for a new machine we will send the patient for a new sleep study in order a new machine as per insurance requirements.  Morbid obesity. -Discussed that weight loss will be beneficial for his health, he is currently losing weight and is planning on continuing.  Diabetes mellitus. - Sleep apnea can contribute to elevated blood glucose, therefore control of sleep apnea is an important part of diabetes management.   Date: 09/02/2017  MRN# [id number] Mike Mike [birth year]    Mike Mike is a 57 y.o. old male seen in Mike for chief complaint of:    Chief Complaint  Patient presents with  . Consult    Referred by Clarene Reamer for OSA managment  . Sleep Apnea    pt needs new cpap machine    HPI:   Mike Mike) is a 57 yo male with a history of OSA several years ago, about 10 yrs ago when they lived in [place].  He has been doing well on CPAP, but recently his machine is not working well, he has issues with his mask. 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 He has a history of tonsillectomy.  No sleep walking, or cataplexy. He denies jaw pain, or TMJ.   PMHX:   Past Medical History:  Diagnosis Date  . Diabetes mellitus without complication [facility])    Surgical Hx:  Past Surgical History:  Procedure Laterality Date  . APPENDECTOMY    . COLONOSCOPY WITH PROPOFOL N/A 08/06/2017   Procedure: COLONOSCOPY WITH PROPOFOL;  Surgeon: Jonathon Bellows, MD;  Location: [facility] ENDOSCOPY;  Service: Gastroenterology;  Laterality: N/A;  . TONSILLECTOMY  1969  .  VASECTOMY  1998   Family Hx:  Family History  Problem Relation Age of Onset  . Cancer Mother   . Diabetes Mother   . Cancer Father   . Diabetes Sister   . Prostate cancer Neg Hx   . Bladder Cancer Neg Hx   . Kidney cancer Neg Hx    Social Hx:   Social History   Tobacco Use  . Smoking status: Never Smoker  . Smokeless tobacco: Never Used  Substance Use Topics  . Alcohol use: Yes    Comment: occ  . Drug use: No   Medication:    Current Outpatient Medications:  .  ibuprofen (ADVIL,MOTRIN) 600 MG tablet, Take by mouth., Disp: , Rfl:  .  metFORMIN (GLUCOPHAGE) 500 MG tablet, Take one tablet once a day with dinner for 7 days then take one tablet twice a day (breakfast and dinner)., Disp: 60 tablet, Rfl: 5 .  NEEDLE, DISP, 20 G (BD DISP NEEDLES) 20G X 1-1/2" MISC, As directed, Disp: 25 each, Rfl: 0 .  pravastatin (PRAVACHOL) 20 MG tablet, Take 1 tablet (20 mg total) by mouth daily., Disp: 90 tablet, Rfl: 3 .  testosterone cypionate (DEPOTESTOSTERONE CYPIONATE) 200 MG/ML injection, Inject 0.7 mLs (140 mg total) into the muscle once a week., Disp: 10 mL, Rfl: 0   Allergies:  Patient has no known allergies.  Review  of Systems: Gen:  Denies  fever, sweats, chills HEENT: Denies blurred vision, double vision. bleeds, sore throat Cvc:  No dizziness, chest pain. Resp:   Denies cough or sputum production, shortness of breath Gi: Denies swallowing difficulty, stomach pain. Gu:  Denies bladder incontinence, burning urine Ext:   No Joint pain, stiffness. Skin: No skin rash,  hives  Endoc:  No polyuria, polydipsia. Psych: No depression, insomnia. Other:  All other systems were reviewed with the patient and were negative other that what is mentioned in the HPI.   Physical Examination:   VS: BP 126/86 (BP Location: Left Arm, Cuff Size: Large)   Pulse (!) 42   Resp 16   Ht 6' (1.829 m)   Wt 299 lb (135.6 kg)   SpO2 97%   BMI 40.55 kg/m   General Appearance: No distress,  obese. Neuro:without focal findings,  speech normal,  HEENT: PERRLA, EOM intact.   Pulmonary: normal breath sounds, No wheezing.  CardiovascularNormal S1,S2.  No m/r/g.   Abdomen: Benign, Soft, non-tender. Renal:  No costovertebral tenderness  GU:  No performed at this time. Endoc: No evident thyromegaly, no signs of acromegaly. Skin:   warm, no rashes, no ecchymosis  Extremities: normal, no cyanosis, clubbing.  Other findings:    LABORATORY PANEL:   CBC No results for input(s): WBC, HGB, HCT, PLT in the last 168 hours. ------------------------------------------------------------------------------------------------------------------  Chemistries  No results for input(s): NA, K, CL, CO2, GLUCOSE, BUN, CREATININE, CALCIUM, MG, AST, ALT, ALKPHOS, BILITOT in the last 168 hours.  Invalid input(s): GFRCGP ------------------------------------------------------------------------------------------------------------------  Cardiac Enzymes No results for input(s): TROPONINI in the last 168 hours. ------------------------------------------------------------  RADIOLOGY:  No results found.     Thank  you for the Mike and for allowing [facility] to assist in the care of your patient. Our recommendations are noted above.  Please contact us if we can be of further service.   Marda Stalker, MD.  Board Certified in Internal Medicine, Pulmonary Medicine, [place], and Sleep Medicine.  [facility] Office Number: [phone]  Patricia Pesa, M.D.  Merton Border, M.D  09/02/2017

## 2017-09-08 ENCOUNTER — Other Ambulatory Visit: Payer: Self-pay

## 2017-10-13 ENCOUNTER — Ambulatory Visit: Payer: BLUE CROSS/BLUE SHIELD | Admitting: Urology

## 2017-10-13 ENCOUNTER — Encounter: Payer: Self-pay | Admitting: Urology

## 2017-10-13 VITALS — BP 123/74 | HR 74 | Resp 16 | Ht 69.0 in | Wt 302.4 lb

## 2017-10-13 DIAGNOSIS — E291 Testicular hypofunction: Secondary | ICD-10-CM

## 2017-10-13 NOTE — Progress Notes (Signed)
10/13/2017 9:52 AM   [name] 01-Aug-1960 [id number]  Referring provider: Elby Beck, [place], [place] [address]  Chief Complaint  Patient presents with  . Follow-up  . Hypogonadism    HPI: Mike Wilson is a 57 year old male who presents for follow-up of hypogonadism.  He was restarted on TRT in October 2018.  He is currently injecting 140 mg every 7-12 days.  He has good energy level and libido.  He denies bothersome lower urinary tract symptoms, breast tenderness/enlargement or lower extremity edema.   PMH: Past Medical History:  Diagnosis Date  . Diabetes mellitus without complication [facility])     Surgical History: Past Surgical History:  Procedure Laterality Date  . APPENDECTOMY    . COLONOSCOPY WITH PROPOFOL N/A 08/06/2017   Procedure: COLONOSCOPY WITH PROPOFOL;  Surgeon: Jonathon Bellows, MD;  Location: [facility] ENDOSCOPY;  Service: Gastroenterology;  Laterality: N/A;  . TONSILLECTOMY  1969  . VASECTOMY  1998    Home Medications:  Allergies as of 10/13/2017   No Known Allergies     Medication List        Accurate as of 10/13/17  9:52 AM. Always use your most recent med list.          ibuprofen 600 MG tablet Commonly known as:  ADVIL,MOTRIN Take by mouth.   metFORMIN 500 MG tablet Commonly known as:  GLUCOPHAGE Take one tablet once a day with dinner for 7 days then take one tablet twice a day (breakfast and dinner).   NEEDLE (DISP) 20 G 20G X 1-1/2" Misc Commonly known as:  BD DISP NEEDLES As directed   pravastatin 20 MG tablet Commonly known as:  PRAVACHOL Take 1 tablet (20 mg total) by mouth daily.   testosterone cypionate 200 MG/ML injection Commonly known as:  DEPOTESTOSTERONE CYPIONATE Inject 0.7 mLs (140 mg total) into the muscle once a week.       Allergies: No Known Allergies  Family History: Family History  Problem Relation Age of Onset  . Cancer Mother   . Diabetes Mother   . Cancer Father   . Diabetes  Sister   . Prostate cancer Neg Hx   . Bladder Cancer Neg Hx   . Kidney cancer Neg Hx     Social History:  reports that he has never smoked. He has never used smokeless tobacco. He reports that he drinks alcohol. He reports that he does not use drugs.  ROS: UROLOGY Frequent Urination?: No Hard to postpone urination?: No Burning/pain with urination?: No Get up at night to urinate?: No Leakage of urine?: No Urine stream starts and stops?: No Trouble starting stream?: No Do you have to strain to urinate?: No Blood in urine?: No Urinary tract infection?: No Sexually transmitted disease?: No Injury to kidneys or bladder?: No Painful intercourse?: No Weak stream?: No Erection problems?: No Penile pain?: No  Gastrointestinal Nausea?: No Vomiting?: No Indigestion/heartburn?: No Diarrhea?: No Constipation?: No  Constitutional Fever: No Night sweats?: No Weight loss?: No Fatigue?: No  Skin Skin rash/lesions?: No Itching?: No  Eyes Blurred vision?: No Double vision?: No  Ears/Nose/Throat Sore throat?: No Sinus problems?: No  Hematologic/Lymphatic Swollen glands?: No Easy bruising?: No  Cardiovascular Leg swelling?: No Chest pain?: No  Respiratory Cough?: No Shortness of breath?: No  Endocrine Excessive thirst?: No  Musculoskeletal Back pain?: No Joint pain?: No  Neurological Headaches?: No Dizziness?: No  Psychologic Depression?: No Anxiety?: No  Physical Exam: BP 123/74   Pulse 74  Resp 16   Ht 5\' 9"  (1.753 m)   Wt (!) 302 lb 6.4 oz (137.2 kg)   SpO2 96%   BMI 44.66 kg/m   Constitutional:  Alert and oriented, No acute distress. HEENT: [place] AT, moist mucus membranes.  Trachea midline, no masses. Cardiovascular: No clubbing, cyanosis, or edema. Respiratory: Normal respiratory effort, no increased work of breathing. GI: Abdomen is soft, nontender, nondistended, no abdominal masses GU: No CVA tenderness.  Prostate 35 g, smooth without  nodules. Lymph: No cervical or inguinal lymphadenopathy. Skin: No rashes, bruises or suspicious lesions. Neurologic: Grossly intact, no focal deficits, moving all 4 extremities. Psychiatric: Normal mood and affect.  Laboratory Data: Lab Results  Component Value Date   WBC 6.4 12/15/2016   HGB 13.1 (A) 12/15/2016   HCT 41 12/15/2016   PLT 248 12/15/2016    Lab Results  Component Value Date   CREATININE 0.8 12/15/2016    Lab Results  Component Value Date   PSA 0.6 12/15/2016    Lab Results  Component Value Date   TESTOSTERONE 426 07/21/2017    Lab Results  Component Value Date   HGBA1C 6.0 08/25/2017     Assessment & Plan:   57 year old male doing well on TRT.  Monitoring blood work was ordered today including PSA, testosterone and hematocrit.  If no significant abnormalities he will follow-up in 6 months for a lab visit in 1 year for labs/exam.   Abbie Sons, MD  [place] [address], [place], [place] [address] [phone]

## 2017-10-14 LAB — HEMATOCRIT: Hematocrit: 42.5 % (ref 37.5–51.0)

## 2017-10-14 LAB — TESTOSTERONE: Testosterone: 236 ng/dL — ABNORMAL LOW (ref 264–916)

## 2017-10-14 LAB — PSA: Prostate Specific Ag, Serum: 0.5 ng/mL (ref 0.0–4.0)

## 2017-10-15 ENCOUNTER — Telehealth: Payer: Self-pay

## 2017-10-15 NOTE — Telephone Encounter (Signed)
Pt informed, he states he will start doing weekly injections.

## 2017-10-15 NOTE — Telephone Encounter (Signed)
lmom for pt to call office

## 2017-10-15 NOTE — Telephone Encounter (Signed)
-----   Message from Abbie Sons, MD sent at 10/14/2017  9:38 AM EDT ----- PSA and hct look good- t level low at 236. Would do injections weekly instead of every 7-12 days unless his sxs are not bothersome.

## 2017-10-18 ENCOUNTER — Encounter: Payer: Self-pay | Admitting: Internal Medicine

## 2017-10-18 DIAGNOSIS — G4733 Obstructive sleep apnea (adult) (pediatric): Secondary | ICD-10-CM | POA: Diagnosis not present

## 2017-10-19 DIAGNOSIS — G4733 Obstructive sleep apnea (adult) (pediatric): Secondary | ICD-10-CM | POA: Diagnosis not present

## 2017-10-20 ENCOUNTER — Telehealth: Payer: Self-pay | Admitting: *Deleted

## 2017-10-20 DIAGNOSIS — G4733 Obstructive sleep apnea (adult) (pediatric): Secondary | ICD-10-CM

## 2017-10-20 NOTE — Telephone Encounter (Signed)
Pt informed of results. States he has a CPAP now and needs a new machine with a local company. Order placed. Nothing further needed.

## 2017-10-27 DIAGNOSIS — G4733 Obstructive sleep apnea (adult) (pediatric): Secondary | ICD-10-CM | POA: Diagnosis not present

## 2017-11-26 DIAGNOSIS — G4733 Obstructive sleep apnea (adult) (pediatric): Secondary | ICD-10-CM | POA: Diagnosis not present

## 2017-12-01 NOTE — Progress Notes (Deleted)
  [facility] Consultation      Assessment and Plan:  Obstructive sleep apnea. -HST reconfirmed presence of severe OSA with AHI of 35.   Morbid obesity. -Discussed that weight loss will be beneficial for his health, he is currently losing weight and is planning on continuing.  Diabetes mellitus. - Sleep apnea can contribute to elevated blood glucose, therefore control of sleep apnea is an important part of diabetes management.   Date: 12/01/2017  MRN# [id number] Mike Wilson Oct 30, 1960    Mike Wilson is a 57 y.o. old male seen in consultation for chief complaint of:    No chief complaint on file.   HPI:   Mr. [name]) is a 57 yo male with a history of OSA several years ago, about 10 yrs ago when they lived in [place].  He has been doing well on CPAP, but recently his machine is not working well, he has issues with his mask. 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 He has a history of tonsillectomy.  No sleep walking, or cataplexy. He denies jaw pain, or TMJ.   **HST 10/18/17>> Severe OSA with AHI of 35.  Medication:    Current Outpatient Medications:  .  ibuprofen (ADVIL,MOTRIN) 600 MG tablet, Take by mouth., Disp: , Rfl:  .  metFORMIN (GLUCOPHAGE) 500 MG tablet, Take one tablet once a day with dinner for 7 days then take one tablet twice a day (breakfast and dinner)., Disp: 60 tablet, Rfl: 5 .  NEEDLE, DISP, 20 G (BD DISP NEEDLES) 20G X 1-1/2" MISC, As directed, Disp: 25 each, Rfl: 0 .  pravastatin (PRAVACHOL) 20 MG tablet, Take 1 tablet (20 mg total) by mouth daily., Disp: 90 tablet, Rfl: 3 .  testosterone cypionate (DEPOTESTOSTERONE CYPIONATE) 200 MG/ML injection, Inject 0.7 mLs (140 mg total) into the muscle once a week., Disp: 10 mL, Rfl: 0   Allergies:  Patient has no known allergies.     LABORATORY PANEL:   CBC No  results for input(s): WBC, HGB, HCT, PLT in the last 168 hours. ------------------------------------------------------------------------------------------------------------------  Chemistries  No results for input(s): NA, K, CL, CO2, GLUCOSE, BUN, CREATININE, CALCIUM, MG, AST, ALT, ALKPHOS, BILITOT in the last 168 hours.  Invalid input(s): GFRCGP ------------------------------------------------------------------------------------------------------------------  Cardiac Enzymes No results for input(s): TROPONINI in the last 168 hours. ------------------------------------------------------------  RADIOLOGY:  No results found.     Thank  you for the consultation and for allowing [facility] to assist in the care of your patient. Our recommendations are noted above.  Please contact us if we can be of further service.   Marda Stalker, MD.  Board Certified in Internal Medicine, Pulmonary Medicine, [place], and Sleep Medicine.  [facility] Office Number: [phone]  Patricia Pesa, M.D.  Merton Border, M.D  12/01/2017

## 2017-12-03 ENCOUNTER — Ambulatory Visit: Payer: BLUE CROSS/BLUE SHIELD | Admitting: Internal Medicine

## 2017-12-03 ENCOUNTER — Encounter: Payer: Self-pay | Admitting: Internal Medicine

## 2017-12-03 VITALS — BP 128/78 | HR 60 | Resp 16 | Ht 69.0 in | Wt 307.0 lb

## 2017-12-03 DIAGNOSIS — G4733 Obstructive sleep apnea (adult) (pediatric): Secondary | ICD-10-CM

## 2017-12-03 NOTE — Progress Notes (Signed)
[facility] Consultation        Date: 12/03/2017  MRN# [id number] Peighton Edgin 17-Jan-1961    Kalob Bergen is a 57 y.o. old male seen in consultation for chief complaint of:    Chief Complaint  Patient presents with  . Sleep Apnea    pt here for f/u on cpap. He just got a new machine and is not having any issues.    HPI:  Patient had home sleep study June 2019 [facility] was 50  Patient has been on AUTOCPAP last 30 days 5-20 cm h20 Doing well with full face mask Less fatigued, less tied  weighs 307 pounds No signs of CHF   No signs of infection   COMPLIANCE REPORT 100% 30 days and >4hrs 100% AHI down to 1.6 AUTOCPAP 5-20 cm h20  Previous history Mr. Gibeault Neta Ehlers) is a 57 yo male with a history of OSA several years ago, about 10 yrs ago when they lived in [place].  He has been doing well on CPAP, but recently his machine is not working well, he has issues with his mask. 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 He has a history of tonsillectomy.  No sleep walking, or cataplexy. He denies jaw pain, or TMJ.   Medication:    Current Outpatient Medications:  .  ibuprofen (ADVIL,MOTRIN) 600 MG tablet, Take by mouth., Disp: , Rfl:  .  metFORMIN (GLUCOPHAGE) 500 MG tablet, Take one tablet once a day with dinner for 7 days then take one tablet twice a day (breakfast and dinner)., Disp: 60 tablet, Rfl: 5 .  NEEDLE, DISP, 20 G (BD DISP NEEDLES) 20G X 1-1/2" MISC, As directed, Disp: 25 each, Rfl: 0 .  pravastatin (PRAVACHOL) 20 MG tablet, Take 1 tablet (20 mg total) by mouth daily., Disp: 90 tablet, Rfl: 3 .  testosterone cypionate (DEPOTESTOSTERONE CYPIONATE) 200 MG/ML injection, Inject 0.7 mLs (140 mg total) into the muscle once a week., Disp: 10 mL, Rfl: 0   Allergies:  Patient has no known allergies.  Review of Systems: Gen:  Denies  fever,  sweats, chills HEENT: Denies blurred vision, double vision. bleeds, sore throat Cvc:  No dizziness, chest pain. Resp:   Denies cough or sputum production, shortness of breath Gi: Denies swallowing difficulty, stomach pain. Gu:  Denies bladder incontinence, burning urine Ext:   No Joint pain, stiffness. Skin: No skin rash,  hives  Endoc:  No polyuria, polydipsia. Psych: No depression, insomnia. Other:  All other systems were reviewed with the patient and were negative other that what is mentioned in the HPI.   Physical Examination:   VS: BP 128/78 (BP Location: Right Arm, Cuff Size: Large)   Pulse 60   Resp 16   Ht 5\' 9"  (1.753 m)   Wt (!) 307 lb (139.3 kg)   SpO2 98%   BMI 45.34 kg/m   General Appearance: No distress, obese. Neuro:without focal findings,  speech normal,  HEENT: PERRLA, EOM intact.   Pulmonary: normal breath sounds, No wheezing.  CardiovascularNormal S1,S2.  No m/r/g.   Abdomen: Benign, Soft, non-tender. Renal:  No costovertebral tenderness  GU:  No performed at this time. Endoc: No evident thyromegaly, no signs of acromegaly. Skin:   warm, no rashes, no ecchymosis  Extremities: normal, no cyanosis, clubbing.    Assessment and Plan:  Obstructive sleep apnea. -Patient is doing well with his CPAP -AHI down to 1.6 with therapy  Morbid obesity. -Discussed that weight loss will be beneficial for his health, he is currently losing weight and is planning on continuing.  Diabetes mellitus. - Sleep apnea can contribute to elevated blood glucose, therefore control of sleep apnea is an important part of diabetes management.   Patient  satisfied with Plan of action and management. All questions answered Follow  Up in 1 year   Tonie Elsey Patricia Pesa, M.D.  [facility]  Medical Director [place] Director [facility] Cardio-Pulmonary Department

## 2017-12-03 NOTE — Patient Instructions (Signed)
Continue CPAP as prescribed Recommend weight loss

## 2017-12-09 ENCOUNTER — Other Ambulatory Visit: Payer: Self-pay | Admitting: Family Medicine

## 2017-12-27 DIAGNOSIS — G4733 Obstructive sleep apnea (adult) (pediatric): Secondary | ICD-10-CM | POA: Diagnosis not present

## 2018-01-27 DIAGNOSIS — G4733 Obstructive sleep apnea (adult) (pediatric): Secondary | ICD-10-CM | POA: Diagnosis not present

## 2018-02-26 DIAGNOSIS — G4733 Obstructive sleep apnea (adult) (pediatric): Secondary | ICD-10-CM | POA: Diagnosis not present

## 2018-03-29 DIAGNOSIS — G4733 Obstructive sleep apnea (adult) (pediatric): Secondary | ICD-10-CM | POA: Diagnosis not present

## 2018-04-19 ENCOUNTER — Other Ambulatory Visit: Payer: Self-pay

## 2018-04-19 DIAGNOSIS — E291 Testicular hypofunction: Secondary | ICD-10-CM

## 2018-04-20 ENCOUNTER — Other Ambulatory Visit: Payer: BLUE CROSS/BLUE SHIELD

## 2018-04-20 DIAGNOSIS — E291 Testicular hypofunction: Secondary | ICD-10-CM | POA: Diagnosis not present

## 2018-04-21 ENCOUNTER — Telehealth: Payer: Self-pay | Admitting: Family Medicine

## 2018-04-21 LAB — TESTOSTERONE: Testosterone: 258 ng/dL — ABNORMAL LOW (ref 264–916)

## 2018-04-21 NOTE — Telephone Encounter (Signed)
LMOM for patient to return call.

## 2018-04-21 NOTE — Telephone Encounter (Signed)
-----   Message from Abbie Sons, MD sent at 04/21/2018  7:50 AM EST ----- PSA was still low at 258.  Has he been doing weekly injections?

## 2018-04-25 NOTE — Telephone Encounter (Signed)
Tried pt on his work number listed, a male answered phone answered saying "this is Marquelle how can I help you" Expressed what office I was calling from and the reasoning for the call, but call was disconnected. Will have to try to reach pt again

## 2018-04-26 NOTE — Telephone Encounter (Signed)
Confirmed with Dr.Stoioff that there was an error in dictation. Lab should state that Testosterone was low at 258. Sent patient my chart message with message below.

## 2018-04-28 DIAGNOSIS — G4733 Obstructive sleep apnea (adult) (pediatric): Secondary | ICD-10-CM | POA: Diagnosis not present

## 2018-05-29 DIAGNOSIS — G4733 Obstructive sleep apnea (adult) (pediatric): Secondary | ICD-10-CM | POA: Diagnosis not present

## 2018-06-29 DIAGNOSIS — G4733 Obstructive sleep apnea (adult) (pediatric): Secondary | ICD-10-CM | POA: Diagnosis not present

## 2018-07-12 ENCOUNTER — Telehealth: Payer: Self-pay | Admitting: Internal Medicine

## 2018-07-12 NOTE — Telephone Encounter (Signed)
Responded to patient via mychart message that this has been completed.

## 2018-07-16 ENCOUNTER — Other Ambulatory Visit: Payer: Self-pay | Admitting: Family Medicine

## 2018-07-21 ENCOUNTER — Telehealth: Payer: Self-pay | Admitting: Family Medicine

## 2018-07-21 NOTE — Telephone Encounter (Signed)
error 

## 2018-07-25 ENCOUNTER — Ambulatory Visit: Payer: BLUE CROSS/BLUE SHIELD | Admitting: Family Medicine

## 2018-07-25 ENCOUNTER — Telehealth: Payer: Self-pay | Admitting: Family Medicine

## 2018-07-25 ENCOUNTER — Encounter: Payer: Self-pay | Admitting: Family Medicine

## 2018-07-25 VITALS — BP 136/90 | HR 71 | Resp 16 | Ht 69.75 in | Wt 309.8 lb

## 2018-07-25 DIAGNOSIS — M720 Palmar fascial fibromatosis [Dupuytren]: Secondary | ICD-10-CM | POA: Diagnosis not present

## 2018-07-25 DIAGNOSIS — K219 Gastro-esophageal reflux disease without esophagitis: Secondary | ICD-10-CM

## 2018-07-25 DIAGNOSIS — E785 Hyperlipidemia, unspecified: Secondary | ICD-10-CM

## 2018-07-25 DIAGNOSIS — E1169 Type 2 diabetes mellitus with other specified complication: Secondary | ICD-10-CM | POA: Diagnosis not present

## 2018-07-25 DIAGNOSIS — E119 Type 2 diabetes mellitus without complications: Secondary | ICD-10-CM

## 2018-07-25 LAB — COMPREHENSIVE METABOLIC PANEL
ALBUMIN: 4.1 g/dL (ref 3.5–5.2)
ALT: 20 U/L (ref 0–53)
AST: 15 U/L (ref 0–37)
Alkaline Phosphatase: 66 U/L (ref 39–117)
BUN: 14 mg/dL (ref 6–23)
CO2: 27 mEq/L (ref 19–32)
Calcium: 9.5 mg/dL (ref 8.4–10.5)
Chloride: 106 mEq/L (ref 96–112)
Creatinine, Ser: 0.82 mg/dL (ref 0.40–1.50)
GFR: 96.54 mL/min (ref >60.00)
Glucose, Bld: 123 mg/dL — ABNORMAL HIGH (ref 70–99)
Potassium: 4.6 mEq/L (ref 3.5–5.1)
Sodium: 139 mEq/L (ref 135–145)
Total Bilirubin: 0.5 mg/dL (ref 0.2–1.2)
Total Protein: 7.1 g/dL (ref 6.0–8.3)

## 2018-07-25 LAB — LIPID PANEL
Cholesterol: 157 mg/dL (ref 0–200)
HDL: 52.5 mg/dL (ref >39.00)
LDL Cholesterol: 83 mg/dL (ref 0–99)
NonHDL: 104.32
Total CHOL/HDL Ratio: 3
Triglycerides: 105 mg/dL (ref 0.0–149.0)
VLDL: 21 mg/dL (ref 0.0–40.0)

## 2018-07-25 LAB — GLUCOSE, POCT (MANUAL RESULT ENTRY): POC Glucose: 133 mg/dl — AB (ref 70–99)

## 2018-07-25 LAB — HEMOGLOBIN A1C: Hgb A1c MFr Bld: 6.3 % (ref 4.6–6.5)

## 2018-07-25 MED ORDER — METFORMIN HCL 500 MG PO TABS: ORAL_TABLET | ORAL | 3 refills | Status: DC

## 2018-07-25 MED ORDER — PRAVASTATIN SODIUM 40 MG PO TABS: 20.0000 mg | ORAL_TABLET | Freq: Every day | ORAL | 3 refills | Status: DC

## 2018-07-25 NOTE — Progress Notes (Addendum)
   Subjective:    Patient ID: Mike Wilson, male    DOB: 1960-11-17, 58 y.o.   MRN: [id number]  HPI This is a 58 yo male patient being seen today for follow up for diabetes.  Patient using new CPAP machine (6 months now), full mask.  Is not checking BS at home. Watching sugar intake in fluids, trying to eat salads for lunch. Staying active with home remodeling project; no formal exercise schedule. Noticing more stomach acid production at night. He contributes this to Metformin.   Denys chest pain/SOB/wheezing/swelling of feet/ankles. Left hand palmar surface with "knot-like" lesion below the the ring finger causing mild contracture of ring finger.  Past Medical History:  Diagnosis Date  . Diabetes mellitus without complication [facility])     Past Surgical History:  Procedure Laterality Date  . APPENDECTOMY    . COLONOSCOPY WITH PROPOFOL N/A 08/06/2017   Procedure: COLONOSCOPY WITH PROPOFOL;  Surgeon: Jonathon Bellows, MD;  Location: [facility] ENDOSCOPY;  Service: Gastroenterology;  Laterality: N/A;  . TONSILLECTOMY  1969  . VASECTOMY  1998    Family History  Problem Relation Age of Onset  . Cancer Mother   . Diabetes Mother   . Cancer Father   . Diabetes Sister   . Prostate cancer Neg Hx   . Bladder Cancer Neg Hx   . Kidney cancer Neg Hx     Social History   Tobacco Use  . Smoking status: Never Smoker  . Smokeless tobacco: Never Used  Substance Use Topics  . Alcohol use: Yes    Comment: occ  . Drug use: No      Review of Systems Per HPI    Objective:   Physical Exam Vitals signs reviewed.  Constitutional:      Appearance: Normal appearance.  HENT:     Head: Normocephalic.  Cardiovascular:     Rate and Rhythm: Normal rate.     Heart sounds: Normal heart sounds.  Pulmonary:     Effort: Pulmonary effort is normal.     Breath sounds: Normal breath sounds.  Musculoskeletal:     Comments: Dupuytren's Disease of left hand affecting ring finger.  Neurological:     Mental  Status: He is alert and oriented to person, place, and time.  Psychiatric:        Attention and Perception: Attention normal.        Mood and Affect: Mood normal.        Behavior: Behavior normal.     BP (!) 144/90 (BP Location: Left Arm, Patient Position: Sitting)   Pulse 71   Resp 16   Ht 5' 9.75" (1.772 m)   Wt (!) 309 lb 12 oz (140.5 kg)   SpO2 96%   BMI 44.76 kg/m   BP Readings from Last 3 Encounters:  07/25/18 (!) 144/90  12/03/17 128/78  10/13/17 123/74    Wt Readings from Last 3 Encounters:  07/25/18 (!) 309 lb 12 oz (140.5 kg)  12/03/17 (!) 307 lb (139.3 kg)  10/13/17 (!) 302 lb 6.4 oz (137.2 kg)      Assessment & Plan:  Controlled type 2 diabetes mellitus without complication, without long-term current use of insulin (HCC) - Plan: POCT glucose (manual entry), Hemoglobin A1c, Comprehensive metabolic panel, Lipid Panel, CANCELED: HgB A1c  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

## 2018-07-25 NOTE — Patient Instructions (Addendum)
Good to see you today  Follow up in 6 months for your complete physical  Work on losing 5 pounds between now and then  I have put in a referral for you to see a hand specialist, if you don't get a call in 5-7 business days, please let me know

## 2018-07-25 NOTE — Addendum Note (Signed)
Addended by: Clarene Reamer B on: 07/25/2018 12:56 PM   Modules accepted: Orders

## 2018-07-25 NOTE — Progress Notes (Signed)
Subjective:    Patient ID: Mike Wilson, male    DOB: Dec 26, 1960, 58 y.o.   MRN: [id number]  HPI This is a 58 yo male who presents today for follow up of DM type 2. Does not check blood sugars at home.  Staying active with renovations..  OSA- wearing CPAP  Acid indigestion- increased evening symptoms, relieved with Tums. Notices after taking metformin. Has been taking after dinner. No other triggers identified.  Dupuytren's contracture- has doubled in size since last year  ROS- no chest pain, no SOB, no LE edema. Occasional loose stools  Past Medical History:  Diagnosis Date  . Diabetes mellitus without complication [facility])    Past Surgical History:  Procedure Laterality Date  . APPENDECTOMY    . COLONOSCOPY WITH PROPOFOL N/A 08/06/2017   Procedure: COLONOSCOPY WITH PROPOFOL;  Surgeon: Jonathon Bellows, MD;  Location: [facility] ENDOSCOPY;  Service: Gastroenterology;  Laterality: N/A;  . TONSILLECTOMY  1969  . VASECTOMY  1998   Family History  Problem Relation Age of Onset  . Cancer Mother   . Diabetes Mother   . Cancer Father   . Diabetes Sister   . Prostate cancer Neg Hx   . Bladder Cancer Neg Hx   . Kidney cancer Neg Hx    Social History   Tobacco Use  . Smoking status: Never Smoker  . Smokeless tobacco: Never Used  Substance Use Topics  . Alcohol use: Yes    Comment: occ  . Drug use: No      Review of Systems Per HPI    Objective:   Physical Exam Physical Exam  Constitutional: Oriented to person, place, and time. He appears well-developed and well-nourished. Obese.  HENT:  Head: Normocephalic and atraumatic.  Eyes: Conjunctivae are normal.  Neck: Normal range of motion. Neck supple.  Cardiovascular: Normal rate, regular rhythm and normal heart sounds.   Pulmonary/Chest: Effort normal and breath sounds normal.  Musculoskeletal: No edema.  Left palm with palpable and visible cord, ring finger with slight contracture.  Neurological: Alert and oriented to person,  place, and time.  Skin: Skin is warm and dry.  Psychiatric: Normal mood and affect. Behavior is normal. Judgment and thought content normal.  Vitals reviewed.    BP (!) 144/90 (BP Location: Left Arm, Patient Position: Sitting)   Pulse 71   Resp 16   Ht 5' 9.75" (1.772 m)   Wt (!) 309 lb 12 oz (140.5 kg)   SpO2 96%   BMI 44.76 kg/m  Wt Readings from Last 3 Encounters:  07/25/18 (!) 309 lb 12 oz (140.5 kg)  12/03/17 (!) 307 lb (139.3 kg)  10/13/17 (!) 302 lb 6.4 oz (137.2 kg)   BP Readings from Last 3 Encounters:  07/25/18 (!) 144/90  12/03/17 128/78  10/13/17 123/74   BP: 136/90(rechecked at end of visit)  Results for orders placed or performed in visit on 07/25/18  POCT glucose (manual entry)  Result Value Ref Range   POC Glucose 133 (A) 70 - 99 mg/dl        Assessment & Plan:  1. Controlled type 2 diabetes mellitus without complication, without long-term current use of insulin (HCC) - last hgba1c 6.0, will recheck today, discussed elevated fasting glucose today - POCT glucose (manual entry) - Hemoglobin A1c - Comprehensive metabolic panel - Lipid Panel  2. Dupuytren's disease of palm of left hand - Ambulatory referral to Hand Surgery  3. Gastroesophageal reflux disease, esophagitis presence not specified - he will try taking  his metformin before dinner  - follow up in 6 months for CPE  Clarene Reamer, FNP-BC  [facility] at [facility], [place]  07/25/2018 9:00 AM

## 2018-07-25 NOTE — Telephone Encounter (Signed)
Please call patient and let him know that I have printed his labs and they are available at the front desk. I sent him a mychart message as well.

## 2018-07-26 NOTE — Telephone Encounter (Signed)
Pt is aware.  

## 2018-07-28 DIAGNOSIS — G4733 Obstructive sleep apnea (adult) (pediatric): Secondary | ICD-10-CM | POA: Diagnosis not present

## 2018-08-09 ENCOUNTER — Encounter: Payer: Self-pay | Admitting: Family Medicine

## 2018-08-09 ENCOUNTER — Telehealth: Payer: Self-pay | Admitting: Family Medicine

## 2018-08-09 NOTE — Telephone Encounter (Signed)
Appt made , left message on machine per instructed and sent letter also.

## 2018-08-09 NOTE — Telephone Encounter (Signed)
Patient returned Helen's call.  Patient needs a referral for a hand specialist.  Patient hasn't seen a hand specialist.  Patient prefers [facility].  Patient prefers early morning and not on a Friday. 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 If patient doesn't answer,patient said you can leave a detailed message on his voice mail with the appointment information.

## 2018-08-25 DIAGNOSIS — M720 Palmar fascial fibromatosis [Dupuytren]: Secondary | ICD-10-CM | POA: Diagnosis not present

## 2018-08-28 DIAGNOSIS — G4733 Obstructive sleep apnea (adult) (pediatric): Secondary | ICD-10-CM | POA: Diagnosis not present

## 2018-09-05 ENCOUNTER — Encounter: Payer: Self-pay | Admitting: Family Medicine

## 2018-09-05 ENCOUNTER — Ambulatory Visit (INDEPENDENT_AMBULATORY_CARE_PROVIDER_SITE_OTHER): Payer: BLUE CROSS/BLUE SHIELD | Admitting: Family Medicine

## 2018-09-05 DIAGNOSIS — L247 Irritant contact dermatitis due to plants, except food: Secondary | ICD-10-CM

## 2018-09-05 MED ORDER — TRIAMCINOLONE ACETONIDE 0.1 % EX CREA: 1.0000 "application " | TOPICAL_CREAM | Freq: Two times a day (BID) | CUTANEOUS | 0 refills | Status: DC

## 2018-09-05 NOTE — Progress Notes (Signed)
Virtual Visit via Video Note  I connected with Mike Wilson on 09/05/18 at  2:00 PM EDT by a video enabled telemedicine application and verified that I am speaking with the correct person using two identifiers.  The patient was inside a restaurant getting his lunch and I was in my office.   I discussed the limitations of evaluation and management by telemedicine and the availability of in person appointments. The patient expressed understanding and agreed to proceed.  History of Present Illness: This is a 58 year old patient who requests a video call to discuss a rash on his arms and legs.  He was clearing trees in his yard about a week ago and came in contact with poison ivy and got a rash on his arms and legs.  He had a little bit beside his left ear and a small amount on his abdomen.  The rash is itchy.  He had been applying some prescription cream that he had at home, he thinks it was triamcinolone.  He has good had good improvement with topical treatment and requests a refill.  He denies any lesions around his eyes or mouth.  Past Medical History:  Diagnosis Date  . Diabetes mellitus without complication [facility])    Past Surgical History:  Procedure Laterality Date  . APPENDECTOMY    . COLONOSCOPY WITH PROPOFOL N/A 08/06/2017   Procedure: COLONOSCOPY WITH PROPOFOL;  Surgeon: Jonathon Bellows, MD;  Location: [facility] ENDOSCOPY;  Service: Gastroenterology;  Laterality: N/A;  . TONSILLECTOMY  1969  . VASECTOMY  1998   Family History  Problem Relation Age of Onset  . Cancer Mother   . Diabetes Mother   . Cancer Father   . Diabetes Sister   . Prostate cancer Neg Hx   . Bladder Cancer Neg Hx   . Kidney cancer Neg Hx    Social History   Tobacco Use  . Smoking status: Never Smoker  . Smokeless tobacco: Never Used  Substance Use Topics  . Alcohol use: Yes    Comment: occ  . Drug use: No     Observations/Objective: The patient is alert and answers questions appropriately.  Head  normocephalic.  He is normally conversive without shortness of breath.  He is able to show me his arms and legs and I can see scattered, raised linear scabbing.  I do not see erythema or drainage.  Assessment and Plan: 1. Irritant contact dermatitis due to plants, except food -He is improving with current treatment will refill steroid cream.  Advised him to contact the office with any worsening symptoms or rash does not completely clear with treatment. - triamcinolone cream (KENALOG) 0.1 %; Apply 1 application topically 2 (two) times daily. Apply sparingly for no more than 10 days.  Dispense: 45 g; Refill: 0   Clarene Reamer, FNP-BC  [facility] at [facility]  09/05/2018 2:24 PM    Follow Up Instructions: Written instructions sent to patient via my chart   I discussed the assessment and treatment plan with the patient. The patient was provided an opportunity to ask questions and all were answered. The patient agreed with the plan and demonstrated an understanding of the instructions.   The patient was advised to call back or seek an in-person evaluation if the symptoms worsen or if the condition fails to improve as anticipated.    Elby Beck, FNP

## 2018-10-12 ENCOUNTER — Other Ambulatory Visit: Payer: Self-pay | Admitting: Urology

## 2018-10-12 ENCOUNTER — Encounter: Payer: Self-pay | Admitting: Family Medicine

## 2018-10-12 DIAGNOSIS — E291 Testicular hypofunction: Secondary | ICD-10-CM

## 2018-10-12 NOTE — Telephone Encounter (Signed)
Pt needs refill on injection

## 2018-10-13 MED ORDER — TESTOSTERONE CYPIONATE 200 MG/ML IM SOLN: 140.0000 mg | INTRAMUSCULAR | 0 refills | Status: DC

## 2018-10-17 NOTE — Telephone Encounter (Signed)
Medication refilled

## 2018-10-21 ENCOUNTER — Ambulatory Visit: Payer: BLUE CROSS/BLUE SHIELD | Admitting: Urology

## 2018-10-26 ENCOUNTER — Ambulatory Visit: Payer: BLUE CROSS/BLUE SHIELD | Admitting: Family Medicine

## 2018-10-26 ENCOUNTER — Other Ambulatory Visit: Payer: Self-pay

## 2018-10-26 ENCOUNTER — Encounter: Payer: Self-pay | Admitting: Family Medicine

## 2018-10-26 ENCOUNTER — Ambulatory Visit (INDEPENDENT_AMBULATORY_CARE_PROVIDER_SITE_OTHER)
Admission: RE | Admit: 2018-10-26 | Discharge: 2018-10-26 | Disposition: A | Discharge status: Home / Self Care | Payer: BC Managed Care – PPO | Source: Ambulatory Visit | Attending: Family Medicine | Admitting: Family Medicine

## 2018-10-26 ENCOUNTER — Ambulatory Visit (INDEPENDENT_AMBULATORY_CARE_PROVIDER_SITE_OTHER): Payer: BC Managed Care – PPO | Admitting: Family Medicine

## 2018-10-26 VITALS — BP 140/88 | HR 68 | Temp 98.8°F | Ht 69.75 in | Wt 313.0 lb

## 2018-10-26 DIAGNOSIS — M1711 Unilateral primary osteoarthritis, right knee: Secondary | ICD-10-CM

## 2018-10-26 DIAGNOSIS — G8929 Other chronic pain: Secondary | ICD-10-CM

## 2018-10-26 DIAGNOSIS — M25561 Pain in right knee: Secondary | ICD-10-CM

## 2018-10-26 MED ORDER — METHYLPREDNISOLONE ACETATE 40 MG/ML IJ SUSP
80.0000 mg | Freq: Once | INTRAMUSCULAR | Status: AC
  Administered 2018-10-26: 80 mg via INTRA_ARTICULAR

## 2018-10-26 MED ORDER — MELOXICAM 15 MG PO TABS: 15.0000 mg | ORAL_TABLET | Freq: Every day | ORAL | 2 refills | Status: DC

## 2018-10-26 NOTE — Patient Instructions (Signed)
OSTEOARTHRITIS:  For symptomatic relief:  Tylenol: 2 tablets up to 3-4 times a day Regular NSAIDS are helpful (avoid in kidney disease and ulcers)  Topical Capzaicin Cream, as needed (wear glove to put on) - THIS IS EXCEPTIONALLY HOT Supplements: Tart cherry juice and Curcumin (Turmeric extract) have good scientific evidence  For flares, corticosteroid injections help. Hyaluronic Acid injections have good success, average relief is 6 months  Glucosamine and Chondroitin often helpful - will take about 3 months to see if you have an effect. If you do, great, keep them up, if none at that point, no need to take in the future.  Omega-3 fish oils may help, 2 grams daily  Ice joints on bad days, 20 min, 2-3 x / day REGULAR EXERCISE: swimming, Yoga, Tai Chi, bicycle (NON-IMPACT activity)   Weight loss will always take stress off of the joints and back  

## 2018-10-26 NOTE — Progress Notes (Signed)
[name] [name], MD Primary Care and Sports Medicine [facility] at [facility] [place], [address] Phone: [phone]  FAX: [phone]  [name] - 58 y.o. male  MRN [id number]  Date of Birth: 1961-02-23  Visit Date: 10/26/2018  PCP: Elby Beck, FNP  Referred by: Elby Beck, FNP  Chief Complaint  Patient presents with  . Knee Pain    Right-No injury   Subjective:   Mike Wilson is a 58 y.o. very pleasant male patient who presents with the following:  Body mass index is 45.23 kg/m.   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  He is not had any specific injury.  He does have primary pain when he gets in and out of the truck.  He does have some occasional pain going up and down stairs, but is not that bad.  He also is currently doing quite a bit of remodeling, and it hurts him when he is trying to get up quite a bit, and he has to use an assistive device or person to help him get up.  He is not having any mechanical symptoms or locking up, and is not having any specific giving way.  He has not had any prior knee surgery or major trauma.  R knee - medial sided.  About 7 years ago had an injection and did better. Truck driver.  Getting out of truck.  No lifting.   Getting back up will bother him.   Motrin, brace  Knee inj, R  Past Medical History, Surgical History, Social History, Family History, Problem List, Medications, and Allergies have been reviewed and updated if relevant.  Patient Active Problem List   Diagnosis Date Noted  . Adenomatous colon polyp 08/06/2017  . Hypogonadism male 03/12/2017  . Benign prostatic hyperplasia with lower urinary tract symptoms 03/12/2017  . History of urethral stricture 03/12/2017    Past Medical History:  Diagnosis Date  . Diabetes mellitus without complication  [facility])     Past Surgical History:  Procedure Laterality Date  . APPENDECTOMY    . COLONOSCOPY WITH PROPOFOL N/A 08/06/2017   Procedure: COLONOSCOPY WITH PROPOFOL;  Surgeon: Jonathon Bellows, MD;  Location: [facility] ENDOSCOPY;  Service: Gastroenterology;  Laterality: N/A;  . TONSILLECTOMY  1969  . VASECTOMY  1998    Social History   Socioeconomic History  . Marital status: Married    Spouse name: Not on file  . Number of children: Not on file  . Years of education: Not on file  . Highest education level: Not on file  Occupational History  . Not on file  Social Needs  . Financial resource strain: Not on file  . Food insecurity:    Worry: Not on file    Inability: Not on file  . Transportation needs:    Medical: Not on file    Non-medical: Not on file  Tobacco Use  . Smoking status: Never Smoker  . Smokeless tobacco: Never Used  Substance and Sexual Activity  . Alcohol use: Yes    Comment: occ  . Drug use: No  . Sexual activity: Yes    Partners: Female  Lifestyle  . Physical activity:    Days per week: Not on file    Minutes per session: Not on file  . Stress: Not on file  Relationships  . Social connections:    Talks on phone: Not on file    Gets together: Not on file    Attends religious service: Not on file    Active member of club or organization: Not on file    Attends meetings of clubs or organizations: Not on file    Relationship status: Not on file  . Intimate partner violence:    Fear of current or ex partner: Not on file    Emotionally abused: Not on file    Physically abused: Not on file    Forced sexual activity: Not on file  Other Topics Concern  . Not on file  Social History Narrative  . Not on file    Family History  Problem Relation Age of Onset  . Cancer Mother   . Diabetes Mother   . Cancer Father   . Diabetes Sister   . Prostate cancer Neg Hx   . Bladder Cancer Neg Hx   . Kidney cancer Neg Hx     No Known Allergies  Medication list  reviewed and updated in full in [place].  GEN: No fevers, chills. Nontoxic. Primarily MSK c/o today. MSK: Detailed in the HPI GI: tolerating PO intake without difficulty Neuro: No numbness, parasthesias, or tingling associated. Otherwise the pertinent positives of the ROS are noted above.   Objective:   BP 140/88   Pulse 68   Temp 98.8 F (37.1 C) (Oral)   Ht 5' 9.75" (1.772 m)   Wt (!) 313 lb (142 kg)   BMI 45.23 kg/m    GEN: WDWN, NAD, Non-toxic, Alert & Oriented x 3 HEENT: Atraumatic, Normocephalic.  Ears and Nose: No external deformity. EXTR: No clubbing/cyanosis/edema NEURO: Normal gait.  PSYCH: Normally interactive. Conversant. Not depressed or anxious appearing.  Calm demeanor.   Knee:  R Gait: Normal heel toe pattern ROM: 0-110 Effusion: neg Echymosis or edema: none Patellar tendon NT Painful PLICA: neg Patellar grind: negative Medial and lateral patellar facet loading: negative medial and lateral joint lines: medial very tender >> Left Mcmurray's + for pain Flexion-pinch + for pain Varus and valgus stress: stable Lachman: neg Ant and Post drawer: neg Hip abduction, IR, ER: WNL Hip flexion str: 5/5 Hip abd: 5/5 Quad: 5/5 VMO atrophy:No Hamstring concentric and eccentric: 5/5   Radiology: Dg Knee 4 Views W/patella Right  Result Date: 10/26/2018 CLINICAL DATA:  Longstanding right knee pain. EXAM: RIGHT KNEE - COMPLETE 4+ VIEW COMPARISON:  None. FINDINGS: Tricompartmental osteoarthritis with marginal osteophytes. Small amount of joint fluid. Patient appears to have some degree of varus deformity. No focal bone lesion. No traumatic change IMPRESSION: Varus deformity with tricompartmental osteoarthritis most pronounced in the medial compartment. Electronically Signed   By: Nelson Chimes M.D.   On: 10/26/2018 13:44     Assessment and Plan:   Primary osteoarthritis of right knee - Plan: methylPREDNISolone acetate (DEPO-MEDROL) injection 80 mg   Chronic pain of right knee - Plan: DG Knee 4 Views W/Patella Right  OA, morbid obesity.  Failure of conservative care and worsening over time.  NSAIDs, ice as needed, strengthening of the quadriceps.  Aspiration/Injection Procedure Note Nevaeh Casillas 08-10-1960 Date of procedure: 10/26/2018  Procedure: Large Joint Aspiration / Injection of Knee, R Indications: Pain  Procedure Details Patient verbally consented to procedure. Risks (including potential rare risk of infection), benefits, and alternatives explained. Sterilely prepped with Chloraprep. Ethyl cholride used for anesthesia. 8 cc Lidocaine 1% mixed with 2 mL Depo-Medrol 40  mg injected using the anteromedial approach without difficulty. No complications with procedure and tolerated well. Patient had decreased pain post-injection. Medication: 2 mL of Depo-Medrol 40 mg, equaling Depo-Medrol 80 mg total   Patient Instructions  OSTEOARTHRITIS:  For symptomatic relief:  Tylenol: 2 tablets up to 3-4 times a day Regular NSAIDS are helpful (avoid in kidney disease and ulcers)  Topical Capzaicin Cream, as needed (wear glove to put on) - THIS IS EXCEPTIONALLY HOT Supplements: Tart cherry juice and Curcumin (Turmeric extract) have good scientific evidence  For flares, corticosteroid injections help. Hyaluronic Acid injections have good success, average relief is 6 months  Glucosamine and Chondroitin often helpful - will take about 3 months to see if you have an effect. If you do, great, keep them up, if none at that point, no need to take in the future.  Omega-3 fish oils may help, 2 grams daily  Ice joints on bad days, 20 min, 2-3 x / day REGULAR EXERCISE: swimming, Yoga, Tai Chi, bicycle (NON-IMPACT activity)   Weight loss will always take stress off of the joints and back     Follow-up: prn  Meds ordered this encounter  Medications  . methylPREDNISolone acetate (DEPO-MEDROL) injection 80 mg  . meloxicam (MOBIC) 15 MG tablet     Sig: Take 1 tablet (15 mg total) by mouth daily.    Dispense:  30 tablet    Refill:  2   Orders Placed This Encounter  Procedures  . DG Knee 4 Views W/Patella Right    Signed,  Frederico Hamman T. Ivi Griffith, MD   Outpatient Encounter Medications as of 10/26/2018  Medication Sig  . metFORMIN (GLUCOPHAGE) 500 MG tablet TAKE 1 TABLET BY MOUTH TWICE DAILY WITH BREAKFAST AND DINNER  . NEEDLE, DISP, 20 G (BD DISP NEEDLES) 20G X 1-1/2" MISC As directed  . pravastatin (PRAVACHOL) 40 MG tablet Take 0.5 tablets (20 mg total) by mouth daily.  Marland Kitchen testosterone cypionate (DEPOTESTOSTERONE CYPIONATE) 200 MG/ML injection Inject 0.7 mLs (140 mg total) into the muscle once a week.  . [DISCONTINUED] triamcinolone cream (KENALOG) 0.1 % Apply 1 application topically 2 (two) times daily. Apply sparingly for no more than 10 days.  . meloxicam (MOBIC) 15 MG tablet Take 1 tablet (15 mg total) by mouth daily.  . [EXPIRED] methylPREDNISolone acetate (DEPO-MEDROL) injection 80 mg    No facility-administered encounter medications on file as of 10/26/2018.

## 2018-10-31 ENCOUNTER — Ambulatory Visit: Payer: BLUE CROSS/BLUE SHIELD | Admitting: Urology

## 2018-12-01 ENCOUNTER — Ambulatory Visit: Payer: BLUE CROSS/BLUE SHIELD | Admitting: Urology

## 2019-02-14 ENCOUNTER — Ambulatory Visit: Payer: Self-pay | Admitting: Urology

## 2019-02-15 ENCOUNTER — Ambulatory Visit: Payer: Self-pay | Admitting: Urology

## 2019-02-22 LAB — HEPATIC FUNCTION PANEL
ALT: 22 (ref 10–40)
AST: 20 (ref 14–40)
Alkaline Phosphatase: 81 (ref 25–125)
Bilirubin, Total: 0.5

## 2019-02-22 LAB — LIPID PANEL
Cholesterol: 175 (ref 0–200)
HDL: 60 (ref 35–70)
LDL Cholesterol: 97
LDl/HDL Ratio: 2.9
Triglycerides: 97 (ref 40–160)

## 2019-02-22 LAB — BASIC METABOLIC PANEL
BUN: 14 (ref 4–21)
Chloride: 104 (ref 99–108)
Creatinine: 0.9 (ref 0.6–1.3)
Glucose: 120
Potassium: 4.6 (ref 3.4–5.3)
Sodium: 141 (ref 137–147)

## 2019-02-22 LAB — COMPREHENSIVE METABOLIC PANEL
Albumin: 4.2 (ref 3.5–5.0)
Calcium: 9.4 (ref 8.7–10.7)
GFR calc Af Amer: 110
GFR calc non Af Amer: 96
Globulin: 2.9

## 2019-02-22 LAB — CBC: RBC: 4.84 (ref 3.87–5.11)

## 2019-02-22 LAB — CBC AND DIFFERENTIAL
HCT: 42 (ref 41–53)
Hemoglobin: 14.3 (ref 13.5–17.5)
Platelets: 222 (ref 150–399)
WBC: 6.1

## 2019-02-22 LAB — HEMOGLOBIN A1C: Hemoglobin A1C: 6.2

## 2019-02-22 LAB — PROSTATE-SPECIFIC AG, SERUM (LABCORP): Prostate Specific Ag, Serum: 0.4

## 2019-02-22 LAB — IRON,TIBC AND FERRITIN PANEL: Iron: 90

## 2019-02-22 LAB — TSH: TSH: 2.67 (ref 0.41–5.90)

## 2019-04-05 ENCOUNTER — Encounter: Payer: Self-pay | Admitting: Urology

## 2019-04-21 ENCOUNTER — Telehealth: Payer: Self-pay | Admitting: Urology

## 2019-04-21 NOTE — Telephone Encounter (Signed)
LMOM for pt to call back to schedule appt

## 2019-04-21 NOTE — Telephone Encounter (Signed)
Please schedule pt for an appt, thanks

## 2019-04-21 NOTE — Telephone Encounter (Signed)
He has not been seen in over 1-1/2 years.  He needs an office visit with me or PA

## 2019-04-21 NOTE — Telephone Encounter (Signed)
Pt needs a refill for testosterone sent to [facility] on [address].

## 2019-04-21 NOTE — Telephone Encounter (Signed)
Please advise 

## 2019-04-25 ENCOUNTER — Encounter: Payer: Self-pay | Admitting: Family Medicine

## 2019-04-25 DIAGNOSIS — Z20828 Contact with and (suspected) exposure to other viral communicable diseases: Secondary | ICD-10-CM | POA: Diagnosis not present

## 2019-04-25 LAB — VITAMIN D, 1,25 + 25-HYDROXY: VITAMIN D 25-HYDROXY: 23.5

## 2019-05-22 ENCOUNTER — Ambulatory Visit: Payer: Self-pay | Admitting: Urology

## 2019-05-24 ENCOUNTER — Ambulatory Visit: Payer: Self-pay | Admitting: Urology

## 2019-06-28 ENCOUNTER — Telehealth: Payer: Self-pay | Admitting: Internal Medicine

## 2019-06-28 ENCOUNTER — Ambulatory Visit: Payer: BC Managed Care – PPO | Admitting: Family Medicine

## 2019-06-28 ENCOUNTER — Telehealth: Payer: Self-pay | Admitting: Family Medicine

## 2019-06-28 ENCOUNTER — Encounter: Payer: Self-pay | Admitting: Family Medicine

## 2019-06-28 ENCOUNTER — Other Ambulatory Visit: Payer: Self-pay

## 2019-06-28 VITALS — BP 122/80 | HR 78 | Temp 97.1°F | Ht 69.75 in | Wt 299.8 lb

## 2019-06-28 DIAGNOSIS — E1169 Type 2 diabetes mellitus with other specified complication: Secondary | ICD-10-CM | POA: Diagnosis not present

## 2019-06-28 DIAGNOSIS — E785 Hyperlipidemia, unspecified: Secondary | ICD-10-CM | POA: Diagnosis not present

## 2019-06-28 DIAGNOSIS — E119 Type 2 diabetes mellitus without complications: Secondary | ICD-10-CM

## 2019-06-28 LAB — POC URINALSYSI DIPSTICK (AUTOMATED)
Bilirubin, UA: NEGATIVE
Blood, UA: NEGATIVE
Glucose, UA: NEGATIVE
Ketones, UA: NEGATIVE
Leukocytes, UA: NEGATIVE
Nitrite, UA: NEGATIVE
Protein, UA: NEGATIVE
Spec Grav, UA: 1.025 (ref 1.010–1.025)
Urobilinogen, UA: 0.2 E.U./dL
pH, UA: 7 (ref 5.0–8.0)

## 2019-06-28 LAB — LIPID PANEL
Cholesterol: 179 mg/dL (ref 0–200)
HDL: 52.1 mg/dL (ref >39.00)
LDL Cholesterol: 100 mg/dL — ABNORMAL HIGH (ref 0–99)
NonHDL: 127.3
Total CHOL/HDL Ratio: 3
Triglycerides: 137 mg/dL (ref 0.0–149.0)
VLDL: 27.4 mg/dL (ref 0.0–40.0)

## 2019-06-28 LAB — HEMOGLOBIN A1C: Hgb A1c MFr Bld: 6.3 % (ref 4.6–6.5)

## 2019-06-28 NOTE — Telephone Encounter (Signed)
Pt called back to let you  Know The place you need to send lab work for DOT CPX  [address] [phone] Fax [phone]  He is schedule to have DOT CPX 2/22 @ 8:30

## 2019-06-28 NOTE — Telephone Encounter (Signed)
Pt has not been seen since 2019.  I called pt and let him know that we must see him first before we can send a compliance report. He verbalized his understanding. I scheduled him for 2/12 at 10am for a phone visit.   After his visit we will then fax his compliance to the [place] [address]. Fax#: [phone] I will leave this encounter open until after his visit.

## 2019-06-28 NOTE — Patient Instructions (Addendum)
Good to see you today  Look at [email or web address]  Youtube videos- Dr. Alvester Chou, Dr. Sharman Cheek   Schedule your complete physical for August.

## 2019-06-28 NOTE — Progress Notes (Signed)
Subjective:    Patient ID: Mike Wilson, male    DOB: 29-Apr-1961, 59 y.o.   MRN: [id number]  HPI Chief Complaint  Patient presents with  . 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 Has appointment for his DOT physical later this month. Requests labs.   CPAP- will get report from [place] [facility]. Feels rested. Has been using CPAP for over 10 years.   DM type 2- has not been checking blood sugars at home.   Hyperlipidemia- has been taking pravastatin 20 mg every other day  Right knee- didn't get much relief with injection. Plans to follow up with Dr. Lorelei Pont. Takes occasional ibuprofen 400 mg with some relief.  Morbid obesity- has lost 14 pounds over last several months. Doing modified Keto.    Eye exam- approximately 1 year ago. Has occasional "floater" in right eye. Comes and goes.   Review of Systems No chest pain, no SOB, no abdominal pain, diarrhea/constipation.  Little swelling of legs.     Objective:   Physical Exam Vitals reviewed.  Constitutional:      General: He is not in acute distress.    Appearance: Normal appearance. He is obese. He is not ill-appearing, toxic-appearing or diaphoretic.  HENT:     Head: Normocephalic and atraumatic.  Eyes:     Conjunctiva/sclera: Conjunctivae normal.  Cardiovascular:     Rate and Rhythm: Normal rate and regular rhythm.     Heart sounds: Normal heart sounds.  Pulmonary:     Effort: Pulmonary effort is normal.     Breath sounds: Normal breath sounds.  Musculoskeletal:     Cervical back: Normal range of motion and neck supple.     Right lower leg: Edema (1+) present.     Left lower leg: Edema (1+) present.  Skin:    General: Skin is warm and dry.  Neurological:     Mental Status: He is alert and oriented to person, place, and time.  Psychiatric:        Mood and Affect: Mood normal.        Behavior: Behavior normal.         Thought Content: Thought content normal.        Judgment: Judgment normal.       BP 122/80 (BP Location: Left Arm, Patient Position: Sitting, Cuff Size: Large)   Pulse 78   Temp (!) 97.1 F (36.2 C) (Temporal)   Ht 5' 9.75" (1.772 m)   Wt 299 lb 12.8 oz (136 kg)   SpO2 96%   BMI 43.33 kg/m  Wt Readings from Last 3 Encounters:  06/28/19 299 lb 12.8 oz (136 kg)  10/26/18 (!) 313 lb (142 kg)  07/25/18 (!) 309 lb 12 oz (140.5 kg)       Assessment & Plan:  1. Controlled type 2 diabetes mellitus without complication, without long-term current use of insulin (HCC) - Hemoglobin A1c - Lipid Panel - POCT Urinalysis Dipstick (Automated) - encouraged him to schedule annual eye exam and have record sent to office  2. Hyperlipidemia associated with type 2 diabetes mellitus ([place]) - Lipid Panel  - follow up in 6 months for CPE This visit occurred during the SARS-CoV-2 public health emergency.  Safety protocols were in place, including screening questions prior to the visit, additional usage of staff PPE, and extensive cleaning of exam room while observing  appropriate contact time as indicated for disinfecting solutions.    Clarene Reamer, FNP-BC  [facility] at [facility] [place] [facility]  06/28/2019 9:27 AM

## 2019-06-30 ENCOUNTER — Telehealth: Payer: Self-pay | Admitting: Internal Medicine

## 2019-06-30 ENCOUNTER — Ambulatory Visit (INDEPENDENT_AMBULATORY_CARE_PROVIDER_SITE_OTHER): Payer: BC Managed Care – PPO | Admitting: Internal Medicine

## 2019-06-30 ENCOUNTER — Encounter: Payer: Self-pay | Admitting: Internal Medicine

## 2019-06-30 DIAGNOSIS — G4733 Obstructive sleep apnea (adult) (pediatric): Secondary | ICD-10-CM | POA: Diagnosis not present

## 2019-06-30 NOTE — Telephone Encounter (Signed)
compliance report has been faxed to provided fax number.  Left detailed message making pt aware.  Nothing further is needed.

## 2019-06-30 NOTE — Telephone Encounter (Signed)
I have notified patient of results. Please fax to [facility] per his request.

## 2019-06-30 NOTE — Telephone Encounter (Signed)
90 day compliance report has been faxed to provided fax number. ' Pt is aware and voiced his understanding.  Nothing further is needed.

## 2019-06-30 NOTE — Progress Notes (Signed)
[facility] Consultation      I connected with the patient by telephone enabled telemedicine visit and verified that I am speaking with the correct person using two identifiers.    I discussed the limitations, risks, security and privacy concerns of performing an evaluation and management service by telemedicine and the availability of in-person appointments. I also discussed with the patient that there may be a patient responsible charge related to this service. The patient expressed understanding and agreed to proceed.  PATIENT AGREES AND CONFIRMS -YES   Other persons participating in the visit and their role in the encounter: Patient, nursing  This visit type was conducted due to national recommendations for restrictions regarding the COVID-19 Pandemic (e.g. social distancing).  This format is felt to be most appropriate for this patient at this time.  All issues noted in this document were discussed and addressed.     SYNOPSIS Previous history Mr. Mike Wilson) is a 59 yo male with a history of OSA several years ago, about 10 yrs ago when they lived in [place].  He has been doing well on CPAP, but recently his machine is not working well, he has issues with his mask. 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 He has a history of tonsillectomy.  No sleep walking, or cataplexy. He denies jaw pain, or TMJ.     Date: 06/30/2019  MRN# [id number] Mike Wilson 1960-12-09   CC FOLLOW UP OSA   HPI:   SLEEP STUDY 2019  AHI 46 SEVERE OSA  ON AUTOCPAP 5-20 CM H20 FULL FACE MASK DOING REALLY WELL WELL CONTROLLED  No evidence of heart failure at this time No evidence or signs of infection at this time No respiratory distress No fevers, chills, nausea, vomiting, diarrhea No evidence of lower extremity edema No evidence hemoptysis  COMPLIANCE  REPORT 100% DAYS AND >4HRS AHI DOWN TO 2.2      Medication:    Current Outpatient Medications:  .  meloxicam (MOBIC) 15 MG tablet, Take 1 tablet (15 mg total) by mouth daily., Disp: 30 tablet, Rfl: 2 .  metFORMIN (GLUCOPHAGE) 500 MG tablet, TAKE 1 TABLET BY MOUTH TWICE DAILY WITH BREAKFAST AND DINNER, Disp: 180 tablet, Rfl: 3 .  NEEDLE, DISP, 20 G (BD DISP NEEDLES) 20G X 1-1/2" MISC, As directed, Disp: 25 each, Rfl: 0 .  pravastatin (PRAVACHOL) 40 MG tablet, Take 0.5 tablets (20 mg total) by mouth daily., Disp: 90 tablet, Rfl: 3 .  testosterone cypionate (DEPOTESTOSTERONE CYPIONATE) 200 MG/ML injection, Inject 0.7 mLs (140 mg total) into the muscle once a week., Disp: 4 mL, Rfl: 0   Allergies:  Patient has no known allergies.   Review of Systems:  Gen:  Denies  fever, sweats, chills weight loss  HEENT: Denies blurred vision, double vision, ear pain, eye pain, hearing loss, nose bleeds, sore throat Cardiac:  No dizziness, chest pain or heaviness, chest tightness,edema, No JVD Resp:   No cough, -sputum production, -shortness of breath,-wheezing, -hemoptysis,  Gi: Denies swallowing difficulty, stomach pain, nausea or vomiting, diarrhea, constipation, bowel incontinence Gu:  Denies bladder incontinence, burning urine Ext:   Denies Joint pain, stiffness or swelling Skin: Denies  skin rash, easy bruising or bleeding or hives Endoc:  Denies polyuria, polydipsia , polyphagia or weight change Psych:   Denies depression, insomnia or hallucinations  Other:  All other systems negative   Assessment and Plan:   OSA DOING WELL ON CPAP EXCELLENT COMPLIANCE REPORT AHI DOWN TO 2  Obesity -recommend significant weight loss -recommend changing diet  Deconditioned state -Recommend increased daily activity and exercise  DM - Sleep apnea can contribute to DM, therefore treatment of sleep apnea is important part of hypertension management.    COVID-19 EDUCATION: The signs and  symptoms of COVID-19 were discussed with the patient and how to seek care for testing.  The importance of social distancing was discussed today. Hand Washing Techniques and avoid touching face was advised.     MEDICATION ADJUSTMENTS/LABS AND TESTS ORDERED: CONTINUE CPAP AS PRESCRIBED   CURRENT MEDICATIONS REVIEWED AT LENGTH WITH PATIENT TODAY   Patient satisfied with Plan of action and management. All questions answered  Follow up in [place], M.D.  [facility]  Medical Director [place] Director [facility] Cardio-Pulmonary Department

## 2019-06-30 NOTE — Patient Instructions (Addendum)
CONTINUE CPAP AS PRESRCIBED  EXCELLENT JOB!! A+!!!

## 2019-07-04 NOTE — Telephone Encounter (Signed)
Labs faxed to [facility]  Nothing further needed.

## 2019-08-21 ENCOUNTER — Ambulatory Visit: Payer: Self-pay | Admitting: Urology

## 2019-10-01 NOTE — Progress Notes (Incomplete)
09/29/19 11:28 PM   Mike Wilson Aug 17, 1960 [id number]  Referring provider: Elby Beck, [place],  [place] [id number] No chief complaint on file.   HPI: Mike Wilson is a 59 y.o. male who presents for a 1 year follow-up of hypogonadism. - Testosterone was 248 on 04/20/2018 - Visited on 04/20/2018 for hypogonadism       PMH: Past Medical History:  Diagnosis Date  . Diabetes mellitus without complication [facility])     Surgical History: Past Surgical History:  Procedure Laterality Date  . APPENDECTOMY    . COLONOSCOPY WITH PROPOFOL N/A 08/06/2017   Procedure: COLONOSCOPY WITH PROPOFOL;  Surgeon: Jonathon Bellows, MD;  Location[facility];  Service: Gastroenterology;  Laterality: N/A;  . TONSILLECTOMY  1969  . VASECTOMY  1998    Home Medications:  Allergies as of 10/02/2019   No Known Allergies     Medication List       Accurate as of Oct 01, 2019 11:28 PM. If you have any questions, ask your nurse or doctor.        meloxicam 15 MG tablet Commonly known as: MOBIC Take 1 tablet (15 mg total) by mouth daily.   metFORMIN 500 MG tablet Commonly known as: GLUCOPHAGE TAKE 1 TABLET BY MOUTH TWICE DAILY WITH BREAKFAST AND DINNER   NEEDLE (DISP) 20 G 20G X 1-1/2" Misc Commonly known as: BD Disp Needles As directed   pravastatin 40 MG tablet Commonly known as: PRAVACHOL Take 0.5 tablets (20 mg total) by mouth daily.   testosterone cypionate 200 MG/ML injection Commonly known as: DEPOTESTOSTERONE CYPIONATE Inject 0.7 mLs (140 mg total) into the muscle once a week.       Allergies: No Known Allergies  Family History: Family History  Problem Relation Age of Onset  . Cancer Mother   . Diabetes Mother   . Cancer Father   . Diabetes Sister   . Prostate cancer Neg Hx   . Bladder Cancer Neg Hx   . Kidney cancer Neg Hx     Social History:  reports that he has never smoked. He has never used smokeless tobacco. He reports current alcohol  use. He reports that he does not use drugs.   Physical Exam: There were no vitals taken for this visit.  Constitutional:  Alert and oriented, No acute distress. HEENT: [place] AT, moist mucus membranes.  Trachea midline, no masses. Cardiovascular: No clubbing, cyanosis, or edema. Respiratory: Normal respiratory effort, no increased work of breathing. GI: Abdomen is soft, nontender, nondistended, no abdominal masses GU: No CVA tenderness Lymph: No cervical or inguinal lymphadenopathy. Skin: No rashes, bruises or suspicious lesions. Neurologic: Grossly intact, no focal deficits, moving all 4 extremities. Psychiatric: Normal mood and affect.  Laboratory Data:  Lab Results  Component Value Date   CREATININE 0.9 02/22/2019    Lab Results  Component Value Date   PSA 0.6 12/15/2016    Lab Results  Component Value Date   TESTOSTERONE 258 (L) 04/20/2018    Lab Results  Component Value Date   HGBA1C 6.3 06/28/2019    Urinalysis   Pertinent Imaging: *** No results found for this or any previous visit. No results found for this or any previous visit. No results found for this or any previous visit. No results found for this or any previous visit. No results found for this or any previous visit. No results found for this or any previous visit. No results found for this or any previous  visit. No results found for this or any previous visit.  Assessment & Plan:     No follow-ups on file.  [facility] [address] [place], [place] [address] [phone]

## 2019-10-02 ENCOUNTER — Ambulatory Visit: Payer: Self-pay | Admitting: Urology

## 2019-10-02 ENCOUNTER — Encounter: Payer: Self-pay | Admitting: Urology

## 2019-10-21 ENCOUNTER — Other Ambulatory Visit: Payer: Self-pay | Admitting: Family Medicine

## 2019-10-21 DIAGNOSIS — E119 Type 2 diabetes mellitus without complications: Secondary | ICD-10-CM

## 2020-01-25 ENCOUNTER — Telehealth: Payer: Self-pay | Admitting: Family Medicine

## 2020-01-25 DIAGNOSIS — E1169 Type 2 diabetes mellitus with other specified complication: Secondary | ICD-10-CM

## 2020-01-25 DIAGNOSIS — E119 Type 2 diabetes mellitus without complications: Secondary | ICD-10-CM

## 2020-01-25 DIAGNOSIS — E785 Hyperlipidemia, unspecified: Secondary | ICD-10-CM

## 2020-01-26 NOTE — Telephone Encounter (Signed)
Please call pt to schedule appt for CPE with fasting labs prior.

## 2020-02-02 ENCOUNTER — Telehealth (INDEPENDENT_AMBULATORY_CARE_PROVIDER_SITE_OTHER): Payer: BC Managed Care – PPO | Admitting: Family Medicine

## 2020-02-02 ENCOUNTER — Other Ambulatory Visit: Payer: Self-pay

## 2020-02-02 ENCOUNTER — Encounter: Payer: Self-pay | Admitting: Family Medicine

## 2020-02-02 DIAGNOSIS — R439 Unspecified disturbances of smell and taste: Secondary | ICD-10-CM

## 2020-02-02 DIAGNOSIS — Z8616 Personal history of COVID-19: Secondary | ICD-10-CM | POA: Diagnosis not present

## 2020-02-02 NOTE — Progress Notes (Signed)
Virtual Visit via Video Note  I connected with Mike Wilson on 02/02/20 at 11:30 AM EDT by a video enabled telemedicine application and verified that I am speaking with the correct person using two identifiers.  Location: Patient: at work Provider: [place] Persons participating in virtual visit: Patient, provider   I discussed the limitations of evaluation and management by telemedicine and the availability of in person appointments. The patient expressed understanding and agreed to proceed. Due to technical difficulties, were unable to connect via video.  Visit was completed with audio only.  History of Present Illness: Chief Complaint  Patient presents with   decreased taste/ smell x 1 month   This is a 59 year old male who presents today for virtual visit with above chief complaint.  He reports that he had a sudden onset of decreased sense of smell and taste that started approximately 1 month ago.  He is able to smell some strong smells and taste some spicy foods.  He does not recall having any upper respiratory infection symptoms prior to symptoms occurring.  He did have COVID-19 04/2019.  He did not have loss of taste or smell at that time.  He has not been vaccinated against COVID-19 and does not plan to do so because he feels that his antibodies should be enough to protect him.  He denies cough, runny nose, fever, headache, visual changes, weight gain/loss.  He has been using some over-the-counter fluticasone and saline nasal spray without improvement.  He uses his CPAP nightly. He denies any history of known concussion or traumatic brain injury.  He did play football in high school.   Observations/Objective: Patient is alert and answers questions appropriately.  He is normally conversive without evidence of increased work of breathing.  Mood and affect are appropriate.  Assessment and Plan: 1. Decreased taste and smell -Unclear etiology, no red flags with  history -Discussed coming into office for physical examination and possible blood work, he is overdue for routine labs -Patient agreed and will call to schedule appointment   Clarene Reamer, FNP-BC  [facility] at [facility]  02/02/2020 12:06 PM   Follow Up Instructions:    I discussed the assessment and treatment plan with the patient. The patient was provided an opportunity to ask questions and all were answered. The patient agreed with the plan and demonstrated an understanding of the instructions.   The patient was advised to call back or seek an in-person evaluation if the symptoms worsen or if the condition fails to improve as anticipated.  Elby Beck, FNP

## 2020-02-07 NOTE — Telephone Encounter (Signed)
Pt scheduled office visit 10/6

## 2020-02-21 ENCOUNTER — Encounter: Payer: Self-pay | Admitting: Family Medicine

## 2020-02-21 ENCOUNTER — Ambulatory Visit: Payer: BC Managed Care – PPO | Admitting: Family Medicine

## 2020-02-21 ENCOUNTER — Other Ambulatory Visit: Payer: Self-pay

## 2020-02-21 VITALS — BP 138/84 | HR 87 | Temp 97.6°F | Ht 69.75 in | Wt 311.0 lb

## 2020-02-21 DIAGNOSIS — E785 Hyperlipidemia, unspecified: Secondary | ICD-10-CM

## 2020-02-21 DIAGNOSIS — E119 Type 2 diabetes mellitus without complications: Secondary | ICD-10-CM

## 2020-02-21 DIAGNOSIS — R439 Unspecified disturbances of smell and taste: Secondary | ICD-10-CM

## 2020-02-21 DIAGNOSIS — E1169 Type 2 diabetes mellitus with other specified complication: Secondary | ICD-10-CM | POA: Diagnosis not present

## 2020-02-21 LAB — CBC WITH DIFFERENTIAL/PLATELET
Basophils Absolute: 0 10*3/uL (ref 0.0–0.1)
Basophils Relative: 0.7 % (ref 0.0–3.0)
Eosinophils Absolute: 0.1 10*3/uL (ref 0.0–0.7)
Eosinophils Relative: 1.9 % (ref 0.0–5.0)
HCT: 42.1 % (ref 39.0–52.0)
Hemoglobin: 14.1 g/dL (ref 13.0–17.0)
Lymphocytes Relative: 21.2 % (ref 12.0–46.0)
Lymphs Abs: 1.3 10*3/uL (ref 0.7–4.0)
MCHC: 33.5 g/dL (ref 30.0–36.0)
MCV: 87.8 fl (ref 78.0–100.0)
Monocytes Absolute: 0.5 10*3/uL (ref 0.1–1.0)
Monocytes Relative: 8.9 % (ref 3.0–12.0)
Neutro Abs: 4 10*3/uL (ref 1.4–7.7)
Neutrophils Relative %: 67.3 % (ref 43.0–77.0)
Platelets: 204 10*3/uL (ref 150.0–400.0)
RBC: 4.79 Mil/uL (ref 4.22–5.81)
RDW: 13.5 % (ref 11.5–15.5)
WBC: 6 10*3/uL (ref 4.0–10.5)

## 2020-02-21 LAB — LIPID PANEL
Cholesterol: 180 mg/dL (ref 0–200)
HDL: 49.7 mg/dL (ref >39.00)
LDL Cholesterol: 105 mg/dL — ABNORMAL HIGH (ref 0–99)
NonHDL: 130.04
Total CHOL/HDL Ratio: 4
Triglycerides: 127 mg/dL (ref 0.0–149.0)
VLDL: 25.4 mg/dL (ref 0.0–40.0)

## 2020-02-21 LAB — TSH: TSH: 2.66 u[IU]/mL (ref 0.35–4.50)

## 2020-02-21 LAB — COMPREHENSIVE METABOLIC PANEL
ALT: 23 U/L (ref 0–53)
AST: 15 U/L (ref 0–37)
Albumin: 4.2 g/dL (ref 3.5–5.2)
Alkaline Phosphatase: 68 U/L (ref 39–117)
BUN: 16 mg/dL (ref 6–23)
CO2: 28 mEq/L (ref 19–32)
Calcium: 9.6 mg/dL (ref 8.4–10.5)
Chloride: 103 mEq/L (ref 96–112)
Creatinine, Ser: 0.82 mg/dL (ref 0.40–1.50)
GFR: 96.48 mL/min (ref >60.00)
Glucose, Bld: 114 mg/dL — ABNORMAL HIGH (ref 70–99)
Potassium: 4.4 mEq/L (ref 3.5–5.1)
Sodium: 138 mEq/L (ref 135–145)
Total Bilirubin: 0.8 mg/dL (ref 0.2–1.2)
Total Protein: 7 g/dL (ref 6.0–8.3)

## 2020-02-21 LAB — MICROALBUMIN / CREATININE URINE RATIO
Creatinine,U: 131.6 mg/dL
Microalb Creat Ratio: 0.5 mg/g (ref 0.0–30.0)
Microalb, Ur: <0.7 mg/dL (ref 0.0–1.9)

## 2020-02-21 LAB — HEMOGLOBIN A1C: Hgb A1c MFr Bld: 6.6 % — ABNORMAL HIGH (ref 4.6–6.5)

## 2020-02-21 LAB — VITAMIN B12: Vitamin B-12: 306 pg/mL (ref 211–911)

## 2020-02-21 NOTE — Progress Notes (Signed)
Subjective:    Patient ID: Mike Wilson, male    DOB: [birth year], 59 y.o.   MRN: [id number]  HPI Chief Complaint  Patient presents with  . Follow-up   This is a 59 yo male who presents today for follow up of DM type 2, hyperlipidemia, loss of taste and smell (see 9/21 telemedicine encounter).   DM type 2- reports reduced activity due to right knee pain. Eats 3 meals a day, out 2x/ week, taking food in to work. Weight up.   Hyperlipidemia- will check today.   Loss of taste and smell- comes and goes. No headache, visual changes, runny nose, nasal congestion, sore throat, ear pain.    Review of Systems Per HPI, denies chest pain, SOB    Objective:   Physical Exam Vitals reviewed.  Constitutional:      Appearance: Normal appearance. He is obese.  HENT:     Head: Normocephalic and atraumatic.     Right Ear: Tympanic membrane, ear canal and external ear normal.     Left Ear: Tympanic membrane, ear canal and external ear normal.     Nose: Nose normal.     Mouth/Throat:     Mouth: Mucous membranes are moist.     Pharynx: Oropharynx is clear.  Eyes:     Extraocular Movements: Extraocular movements intact.     Conjunctiva/sclera: Conjunctivae normal.     Pupils: Pupils are equal, round, and reactive to light.  Cardiovascular:     Rate and Rhythm: Normal rate and regular rhythm.     Heart sounds: Normal heart sounds.  Pulmonary:     Effort: Pulmonary effort is normal.     Breath sounds: Normal breath sounds.  Musculoskeletal:     Cervical back: Normal range of motion and neck supple.  Skin:    General: Skin is warm and dry.  Neurological:     Mental Status: He is alert and oriented to person, place, and time.     Cranial Nerves: No cranial nerve deficit.     Gait: Gait normal.  Psychiatric:        Mood and Affect: Mood normal.        Behavior: Behavior normal.        Thought Content: Thought content normal.        Judgment: Judgment normal.       BP 138/84   Pulse  87   Temp 97.6 F (36.4 C) (Temporal)   Ht 5' 9.75" (1.772 m)   Wt (!) 311 lb (141.1 kg)   SpO2 97%   BMI 44.94 kg/m  Wt Readings from Last 3 Encounters:  02/21/20 (!) 311 lb (141.1 kg)  06/28/19 299 lb 12.8 oz (136 kg)  10/26/18 (!) 313 lb (142 kg)       Assessment & Plan:  1. Decreased taste and smell - no worrisome history or physical exam, will check labs and refer as indicated - ANA - TSH - CBC with Differential - Comprehensive metabolic panel  2. Controlled type 2 diabetes mellitus without complication, without long-term current use of insulin (HCC) - weight up, discussed diet, decreasing carbs, shortening eating window - Lipid Panel - Hemoglobin A1c - Vitamin B12 - Microalbumin / creatinine urine ratio - CBC with Differential - Comprehensive metabolic panel  3. Hyperlipidemia associated with type 2 diabetes mellitus (HCC) - Lipid Panel - CBC with Differential - Comprehensive metabolic panel  This visit occurred during the SARS-CoV-2 public health emergency.  Safety protocols were in place,  including screening questions prior to the visit, additional usage of staff PPE, and extensive cleaning of exam room while observing appropriate contact time as indicated for disinfecting solutions.    Clarene Reamer, FNP-BC  [facility] at [facility] [place] [facility]  02/21/2020 8:19 AM

## 2020-02-26 NOTE — Addendum Note (Signed)
Addended by: Clarene Reamer B on: 02/26/2020 07:33 AM   Modules accepted: Orders

## 2020-02-28 LAB — ANTI-NUCLEAR AB-TITER (ANA TITER): ANA Titer 1: 1:80 {titer} — ABNORMAL HIGH

## 2020-02-28 LAB — ANA: Anti Nuclear Antibody (ANA): POSITIVE — AB

## 2020-03-13 ENCOUNTER — Encounter: Payer: Self-pay | Admitting: Family Medicine

## 2020-03-14 ENCOUNTER — Telehealth: Payer: Self-pay | Admitting: Internal Medicine

## 2020-03-14 NOTE — Telephone Encounter (Signed)
Lm x1 for patient.  

## 2020-03-20 NOTE — Telephone Encounter (Signed)
Lm x2 for patient.  Will close encounter per office protocol.   

## 2020-05-09 ENCOUNTER — Other Ambulatory Visit: Payer: Self-pay | Admitting: Family Medicine

## 2020-05-09 DIAGNOSIS — E1169 Type 2 diabetes mellitus with other specified complication: Secondary | ICD-10-CM

## 2020-05-09 DIAGNOSIS — E119 Type 2 diabetes mellitus without complications: Secondary | ICD-10-CM

## 2020-05-09 DIAGNOSIS — E785 Hyperlipidemia, unspecified: Secondary | ICD-10-CM

## 2020-05-09 NOTE — Telephone Encounter (Signed)
Pharmacy requests refill on: Pravastatin 40 mg   LAST REFILL: 01/26/2020 (Q-45, R-0) LAST OV: 02/21/2020 NEXT OV: Not Scheduled  PHARMACY: [id number] [place], [place]

## 2020-05-10 ENCOUNTER — Other Ambulatory Visit: Payer: Self-pay | Admitting: Family Medicine

## 2020-05-10 DIAGNOSIS — E119 Type 2 diabetes mellitus without complications: Secondary | ICD-10-CM

## 2020-05-13 NOTE — Telephone Encounter (Signed)
Pharmacy requests refill on: Metformin 500 mg   LAST REFILL: 10/23/2019 (Q-180, R-1) LAST OV: 02/21/2020 NEXT OV: Not Scheduled  PHARMACY: [id number] [place], [place]  Hgb A1C (02/21/2020): 6.6

## 2020-05-14 ENCOUNTER — Telehealth: Payer: Self-pay | Admitting: Family Medicine

## 2020-05-14 NOTE — Telephone Encounter (Signed)
Sending mychart message to pt to check in

## 2020-06-12 ENCOUNTER — Telehealth: Payer: Self-pay | Admitting: Internal Medicine

## 2020-06-12 NOTE — Telephone Encounter (Signed)
30 day compliance report has been placed up front for pickup. Patient is aware and voiced his understanding.  Nothing further needed.

## 2020-06-12 NOTE — Telephone Encounter (Signed)
Called and spoke with patient regarding his questions. Patient stated that he was referred to an ENT and wanted to know what Dr. Reola Mosher number was. Office number provided. Patient stated that he would call today and get an appointment scheduled. Will send to scheduler to establish [facility] appointment.

## 2020-06-12 NOTE — Telephone Encounter (Signed)
Routing to RN to call patient and see what his questions/concerns are.   Overall, he will likely need a f/u visit and TOC.

## 2020-06-19 ENCOUNTER — Telehealth: Payer: Self-pay | Admitting: Internal Medicine

## 2020-06-19 DIAGNOSIS — Z0189 Encounter for other specified special examinations: Secondary | ICD-10-CM | POA: Diagnosis not present

## 2020-06-19 NOTE — Telephone Encounter (Signed)
90 day compliance report has been placed up front for pick up.  Patient is aware and voiced his understanding.  Nothing further needed.

## 2020-06-28 ENCOUNTER — Other Ambulatory Visit: Payer: Self-pay

## 2020-06-28 DIAGNOSIS — E1169 Type 2 diabetes mellitus with other specified complication: Secondary | ICD-10-CM

## 2020-06-28 DIAGNOSIS — E119 Type 2 diabetes mellitus without complications: Secondary | ICD-10-CM

## 2020-06-28 DIAGNOSIS — E785 Hyperlipidemia, unspecified: Secondary | ICD-10-CM

## 2020-06-28 MED ORDER — PRAVASTATIN SODIUM 40 MG PO TABS: ORAL_TABLET | ORAL | 0 refills | Status: DC

## 2020-07-05 DIAGNOSIS — H903 Sensorineural hearing loss, bilateral: Secondary | ICD-10-CM | POA: Diagnosis not present

## 2020-07-05 DIAGNOSIS — R430 Anosmia: Secondary | ICD-10-CM | POA: Diagnosis not present

## 2020-07-08 ENCOUNTER — Other Ambulatory Visit: Payer: Self-pay | Admitting: Unknown Physician Specialty

## 2020-07-08 DIAGNOSIS — R430 Anosmia: Secondary | ICD-10-CM

## 2020-07-18 ENCOUNTER — Ambulatory Visit: Payer: BC Managed Care – PPO | Admitting: Family Medicine

## 2020-07-23 ENCOUNTER — Other Ambulatory Visit: Payer: Self-pay

## 2020-07-23 ENCOUNTER — Ambulatory Visit
Admission: RE | Admit: 2020-07-23 | Discharge: 2020-07-23 | Disposition: A | Discharge status: Home / Self Care | Payer: BC Managed Care – PPO | Source: Ambulatory Visit | Attending: Unknown Physician Specialty | Admitting: Unknown Physician Specialty

## 2020-07-23 DIAGNOSIS — R430 Anosmia: Secondary | ICD-10-CM | POA: Diagnosis not present

## 2020-07-23 DIAGNOSIS — I6782 Cerebral ischemia: Secondary | ICD-10-CM | POA: Diagnosis not present

## 2020-07-23 DIAGNOSIS — R439 Unspecified disturbances of smell and taste: Secondary | ICD-10-CM | POA: Diagnosis not present

## 2020-07-23 MED ORDER — GADOBUTROL 1 MMOL/ML IV SOLN
10.0000 mL | Freq: Once | INTRAVENOUS | Status: AC | PRN
  Administered 2020-07-23: 10 mL via INTRAVENOUS

## 2020-08-05 ENCOUNTER — Encounter: Payer: Self-pay | Admitting: Family Medicine

## 2020-08-05 ENCOUNTER — Ambulatory Visit: Payer: BC Managed Care – PPO | Admitting: Family Medicine

## 2020-08-05 ENCOUNTER — Other Ambulatory Visit: Payer: Self-pay

## 2020-08-05 VITALS — BP 138/90 | HR 68 | Temp 97.7°F | Ht 70.0 in | Wt 314.5 lb

## 2020-08-05 DIAGNOSIS — R430 Anosmia: Secondary | ICD-10-CM | POA: Diagnosis not present

## 2020-08-05 DIAGNOSIS — Z808 Family history of malignant neoplasm of other organs or systems: Secondary | ICD-10-CM

## 2020-08-05 DIAGNOSIS — M1711 Unilateral primary osteoarthritis, right knee: Secondary | ICD-10-CM

## 2020-08-05 DIAGNOSIS — E1169 Type 2 diabetes mellitus with other specified complication: Secondary | ICD-10-CM | POA: Diagnosis not present

## 2020-08-05 DIAGNOSIS — I679 Cerebrovascular disease, unspecified: Secondary | ICD-10-CM

## 2020-08-05 DIAGNOSIS — E785 Hyperlipidemia, unspecified: Secondary | ICD-10-CM

## 2020-08-05 NOTE — Assessment & Plan Note (Signed)
Previous success with knee injection. He will make f/u appt with Dr. Lorelei Pont and start doing home PT exercises.

## 2020-08-05 NOTE — Assessment & Plan Note (Signed)
Referral to dermatology for skin check.

## 2020-08-05 NOTE — Assessment & Plan Note (Signed)
Discussed transition from prediabetes to diabetes. Hand out for nutrition and discussed low carb options. Cont metformin 500 mg BID. Work on diet changes. Return 3 months for hgba1c check

## 2020-08-05 NOTE — Assessment & Plan Note (Addendum)
Seeing ENT. MRI normal. Abnormal taste. Did not re-test for covid when this occurred but discussed this could be the cause. Appreciate ent support.

## 2020-08-05 NOTE — Assessment & Plan Note (Signed)
Microvascular changes on MRI. Cont pravastatin 40 mg and start asa 81 mg for stroke prevention.

## 2020-08-05 NOTE — Progress Notes (Signed)
Subjective:     Mike Wilson is a 60 y.o. male presenting for Transitions Of Care     HPI  #Right knee pain - hurting more today - saw Dr. Lorelei Wilson about 1 year ago - got a cortisone shot  - did not do physical therapy at that time - has done PT before -   #DM - taking metformin - does not follow diabetic diet - recently switched to diabetic range - recent eye exam normal  Review of Systems   Social History   Tobacco Use  Smoking Status Never Smoker  Smokeless Tobacco Never Used        Objective:    BP Readings from Last 3 Encounters:  08/05/20 138/90  02/21/20 138/84  06/28/19 122/80   Wt Readings from Last 3 Encounters:  08/05/20 (!) 314 lb 8 oz (142.7 kg)  02/21/20 (!) 311 lb (141.1 kg)  06/28/19 299 lb 12.8 oz (136 kg)    BP 138/90   Pulse 68   Temp 97.7 F (36.5 C) (Temporal)   Ht 5\' 10"  (1.778 m)   Wt (!) 314 lb 8 oz (142.7 kg)   SpO2 96%   BMI 45.13 kg/m    Physical Exam Constitutional:      Appearance: Normal appearance. He is obese. He is not ill-appearing or diaphoretic.  HENT:     Right Ear: External ear normal.     Left Ear: External ear normal.  Eyes:     General: No scleral icterus.    Extraocular Movements: Extraocular movements intact.     Conjunctiva/sclera: Conjunctivae normal.  Cardiovascular:     Rate and Rhythm: Normal rate and regular rhythm.     Heart sounds: No murmur heard.   Pulmonary:     Effort: Pulmonary effort is normal. No respiratory distress.     Breath sounds: Normal breath sounds. No wheezing.  Musculoskeletal:     Cervical back: Neck supple.     Comments: Medial joint line ttp  Skin:    General: Skin is warm and dry.  Neurological:     Mental Status: He is alert. Mental status is at baseline.  Psychiatric:        Mood and Affect: Mood normal.           Assessment & Plan:   Problem List Items Addressed This Visit      Cardiovascular and Mediastinum   Cerebral vascular disease     Microvascular changes on MRI. Cont pravastatin 40 mg and start asa 81 mg for stroke prevention.         Endocrine   Type 2 diabetes mellitus with other specified complication ([place]) - Primary    Discussed transition from prediabetes to diabetes. Hand out for nutrition and discussed low carb options. Cont metformin 500 mg BID. Work on diet changes. Return 3 months for hgba1c check      Hyperlipidemia associated with type 2 diabetes mellitus (HCC)    Cont pravastatin 40 mg daily. Goal LDL <70        Musculoskeletal and Integument   Osteoarthritis of right knee    Previous success with knee injection. He will make f/u appt with Dr. Lorelei Wilson and start doing home PT exercises.         Other   Anosmia    Seeing ENT. MRI normal. Abnormal taste. Did not re-test for covid when this occurred but discussed this could be the cause. Appreciate ent support.       Family  history of melanoma    Referral to dermatology for skin check.       Relevant Orders   Ambulatory referral to Dermatology       Return in about 3 months (around 11/05/2020).  Lesleigh Noe, MD  This visit occurred during the SARS-CoV-2 public health emergency.  Safety protocols were in place, including screening questions prior to the visit, additional usage of staff PPE, and extensive cleaning of exam room while observing appropriate contact time as indicated for disinfecting solutions.

## 2020-08-05 NOTE — Assessment & Plan Note (Signed)
Cont pravastatin 40 mg daily. Goal LDL <70

## 2020-08-05 NOTE — Patient Instructions (Addendum)
Consider starting a baby aspirin for stroke prevention    #Referral I have placed a referral to a specialist for you. You should receive a phone call from the specialty office. Make sure your voicemail is not full and that if you are able to answer your phone to unknown or new numbers.   It may take up to 2 weeks to hear about the referral. If you do not hear anything in 2 weeks, please call our office and ask to speak with the referral coordinator.     Diabetes Mellitus and Nutrition, Adult When you have diabetes, or diabetes mellitus, it is very important to have healthy eating habits because your blood sugar (glucose) levels are greatly affected by what you eat and drink. Eating healthy foods in the right amounts, at about the same times every day, can help you:  Control your blood glucose.  Lower your risk of heart disease.  Improve your blood pressure.  Reach or maintain a healthy weight. What can affect my meal plan? Every person with diabetes is different, and each person has different needs for a meal plan. Your health care provider may recommend that you work with a dietitian to make a meal plan that is best for you. Your meal plan may vary depending on factors such as:  The calories you need.  The medicines you take.  Your weight.  Your blood glucose, blood pressure, and cholesterol levels.  Your activity level.  Other health conditions you have, such as heart or kidney disease. How do carbohydrates affect me? Carbohydrates, also called carbs, affect your blood glucose level more than any other type of food. Eating carbs naturally raises the amount of glucose in your blood. Carb counting is a method for keeping track of how many carbs you eat. Counting carbs is important to keep your blood glucose at a healthy level, especially if you use insulin or take certain oral diabetes medicines. It is important to know how many carbs you can safely have in each meal. This is  different for every person. Your dietitian can help you calculate how many carbs you should have at each meal and for each snack. How does alcohol affect me? Alcohol can cause a sudden decrease in blood glucose (hypoglycemia), especially if you use insulin or take certain oral diabetes medicines. Hypoglycemia can be a life-threatening condition. Symptoms of hypoglycemia, such as sleepiness, dizziness, and confusion, are similar to symptoms of having too much alcohol.  Do not drink alcohol if: ? Your health care provider tells you not to drink. ? You are pregnant, may be pregnant, or are planning to become pregnant.  If you drink alcohol: ? Do not drink on an empty stomach. ? Limit how much you use to:  0-1 drink a day for women.  0-2 drinks a day for men. ? Be aware of how much alcohol is in your drink. In the [place] one drink equals one 12 oz bottle of beer (355 mL), one 5 oz glass of wine (148 mL), or one 1 oz glass of hard liquor (44 mL). ? Keep yourself hydrated with water, diet soda, or unsweetened iced tea.  Keep in mind that regular soda, juice, and other mixers may contain a lot of sugar and must be counted as carbs. What are tips for following this plan? Reading food labels  Start by checking the serving size on the "Nutrition Facts" label of packaged foods and drinks. The amount of calories, carbs, fats, and other nutrients  listed on the label is based on one serving of the item. Many items contain more than one serving per package.  Check the total grams (g) of carbs in one serving. You can calculate the number of servings of carbs in one serving by dividing the total carbs by 15. For example, if a food has 30 g of total carbs per serving, it would be equal to 2 servings of carbs.  Check the number of grams (g) of saturated fats and trans fats in one serving. Choose foods that have a low amount or none of these fats.  Check the number of milligrams (mg) of salt (sodium) in one  serving. Most people should limit total sodium intake to less than 2,300 mg per day.  Always check the nutrition information of foods labeled as "low-fat" or "nonfat." These foods may be higher in added sugar or refined carbs and should be avoided.  Talk to your dietitian to identify your daily goals for nutrients listed on the label. Shopping  Avoid buying canned, pre-made, or processed foods. These foods tend to be high in fat, sodium, and added sugar.  Shop around the outside edge of the grocery store. This is where you will most often find fresh fruits and vegetables, bulk grains, fresh meats, and fresh dairy. Cooking  Use low-heat cooking methods, such as baking, instead of high-heat cooking methods like deep frying.  Cook using healthy oils, such as olive, canola, or sunflower oil.  Avoid cooking with butter, cream, or high-fat meats. Meal planning  Eat meals and snacks regularly, preferably at the same times every day. Avoid going long periods of time without eating.  Eat foods that are high in fiber, such as fresh fruits, vegetables, beans, and whole grains. Talk with your dietitian about how many servings of carbs you can eat at each meal.  Eat 4-6 oz (112-168 g) of lean protein each day, such as lean meat, chicken, fish, eggs, or tofu. One ounce (oz) of lean protein is equal to: ? 1 oz (28 g) of meat, chicken, or fish. ? 1 egg. ?  cup (62 g) of tofu.  Eat some foods each day that contain healthy fats, such as avocado, nuts, seeds, and fish.   What foods should I eat? Fruits Berries. Apples. Oranges. Peaches. Apricots. Plums. Grapes. Mango. Papaya. Pomegranate. Kiwi. Cherries. Vegetables Lettuce. Spinach. Leafy greens, including kale, chard, collard greens, and mustard greens. Beets. Cauliflower. Cabbage. Broccoli. Carrots. Green beans. Tomatoes. Peppers. Onions. Cucumbers. Brussels sprouts. Grains Whole grains, such as whole-wheat or whole-grain bread, crackers,  tortillas, cereal, and pasta. Unsweetened oatmeal. Quinoa. Brown or wild rice. Meats and other proteins Seafood. Poultry without skin. Lean cuts of poultry and beef. Tofu. Nuts. Seeds. Dairy Low-fat or fat-free dairy products such as milk, yogurt, and cheese. The items listed above may not be a complete list of foods and beverages you can eat. Contact a dietitian for more information. What foods should I avoid? Fruits Fruits canned with syrup. Vegetables Canned vegetables. Frozen vegetables with butter or cream sauce. Grains Refined white flour and flour products such as bread, pasta, snack foods, and cereals. Avoid all processed foods. Meats and other proteins Fatty cuts of meat. Poultry with skin. Breaded or fried meats. Processed meat. Avoid saturated fats. Dairy Full-fat yogurt, cheese, or milk. Beverages Sweetened drinks, such as soda or iced tea. The items listed above may not be a complete list of foods and beverages you should avoid. Contact a dietitian for more information.  Questions to ask a health care provider  Do I need to meet with a diabetes educator?  Do I need to meet with a dietitian?  What number can I call if I have questions?  When are the best times to check my blood glucose? Where to find more information:  American Diabetes Association: diabetes.org  Academy of Nutrition and Dietetics: www.eatright.[employer] of Diabetes and Digestive and Kidney Diseases: [employer]  Association of Diabetes Care and Education Specialists: www.diabeteseducator.org Summary  It is important to have healthy eating habits because your blood sugar (glucose) levels are greatly affected by what you eat and drink.  A healthy meal plan will help you control your blood glucose and maintain a healthy lifestyle.  Your health care provider may recommend that you work with a dietitian to make a meal plan that is best for you.  Keep in mind that carbohydrates  (carbs) and alcohol have immediate effects on your blood glucose levels. It is important to count carbs and to use alcohol carefully. This information is not intended to replace advice given to you by your health care provider. Make sure you discuss any questions you have with your health care provider. Document Revised: 04/11/2019 Document Reviewed: 04/11/2019 Elsevier Patient Education  2021 [employer].

## 2020-12-10 ENCOUNTER — Other Ambulatory Visit: Payer: Self-pay

## 2020-12-10 DIAGNOSIS — E1169 Type 2 diabetes mellitus with other specified complication: Secondary | ICD-10-CM

## 2020-12-10 DIAGNOSIS — E119 Type 2 diabetes mellitus without complications: Secondary | ICD-10-CM

## 2020-12-10 NOTE — Telephone Encounter (Signed)
Patient over due for 3 months follow up on Diabetes. Also, need to verify if he is still taking Pravastatin 1/2 tablet daily or 1 tablet.  Refill request came in from [place].  Left message for patient to call back to discuss.

## 2020-12-18 MED ORDER — PRAVASTATIN SODIUM 40 MG PO TABS: ORAL_TABLET | ORAL | 1 refills | Status: DC

## 2020-12-24 ENCOUNTER — Encounter: Payer: Self-pay | Admitting: Family Medicine

## 2020-12-24 ENCOUNTER — Other Ambulatory Visit: Payer: Self-pay

## 2020-12-24 ENCOUNTER — Ambulatory Visit: Payer: BC Managed Care – PPO | Admitting: Family Medicine

## 2020-12-24 VITALS — BP 148/98 | HR 69 | Temp 97.7°F | Ht 70.0 in | Wt 322.0 lb

## 2020-12-24 DIAGNOSIS — I10 Essential (primary) hypertension: Secondary | ICD-10-CM | POA: Diagnosis not present

## 2020-12-24 DIAGNOSIS — E119 Type 2 diabetes mellitus without complications: Secondary | ICD-10-CM | POA: Diagnosis not present

## 2020-12-24 DIAGNOSIS — M1711 Unilateral primary osteoarthritis, right knee: Secondary | ICD-10-CM | POA: Diagnosis not present

## 2020-12-24 DIAGNOSIS — E1169 Type 2 diabetes mellitus with other specified complication: Secondary | ICD-10-CM

## 2020-12-24 LAB — POCT GLYCOSYLATED HEMOGLOBIN (HGB A1C): Hemoglobin A1C: 6.4 % — AB (ref 4.0–5.6)

## 2020-12-24 MED ORDER — METFORMIN HCL 500 MG PO TABS: 500.0000 mg | ORAL_TABLET | Freq: Two times a day (BID) | ORAL | 3 refills | Status: DC

## 2020-12-24 MED ORDER — LISINOPRIL 10 MG PO TABS: 10.0000 mg | ORAL_TABLET | Freq: Every day | ORAL | 3 refills | Status: DC

## 2020-12-24 NOTE — Assessment & Plan Note (Signed)
Reviewed XR from 10/2018 with arthritis. Discussed PT and referral placed. Encouraged f/u with Dr. Lorelei Pont if wanting another injection.

## 2020-12-24 NOTE — Progress Notes (Signed)
Subjective:     Nivan Mckeague is a 60 y.o. male presenting for Follow-up (DM )     HPI  #Diabetes Currently taking metformin (Glucophage, [place])  Using medications without difficulties: No Feet problems:No  Blood Sugars averaging: does not check Last HgbA1c:  Lab Results  Component Value Date   HGBA1C 6.4 (A) 12/24/2020   Diet: doing ok with diet  Diabetes Health Maintenance Due:    Diabetes Health Maintenance Due  Topic Date Due   OPHTHALMOLOGY EXAM  03/01/2018   HEMOGLOBIN A1C  06/26/2021   FOOT EXAM  08/05/2021    #knee pain - saw Dr. Lorelei Pont last march - had an injection at that time - lasted 2-3 months - previously had PT in the past - and with injection felt this helped  #elevated bp  - does not check at home - has been feeling well -   Review of Systems  08/05/2020: clinic - DM - cont metformin 500 mg bid and work on diet  Social History   Tobacco Use  Smoking Status Never  Smokeless Tobacco Never        Objective:    BP Readings from Last 3 Encounters:  12/24/20 (!) 148/98  08/05/20 138/90  02/21/20 138/84   Wt Readings from Last 3 Encounters:  12/24/20 (!) 322 lb (146.1 kg)  08/05/20 (!) 314 lb 8 oz (142.7 kg)  02/21/20 (!) 311 lb (141.1 kg)    BP (!) 148/98   Pulse 69   Temp 97.7 F (36.5 C) (Temporal)   Ht '5\' 10"'$  (1.778 m)   Wt (!) 322 lb (146.1 kg)   SpO2 97%   BMI 46.20 kg/m    Physical Exam Constitutional:      Appearance: Normal appearance. He is obese. He is not ill-appearing or diaphoretic.  HENT:     Right Ear: External ear normal.     Left Ear: External ear normal.  Eyes:     General: No scleral icterus.    Extraocular Movements: Extraocular movements intact.     Conjunctiva/sclera: Conjunctivae normal.  Cardiovascular:     Rate and Rhythm: Normal rate and regular rhythm.     Heart sounds: No murmur heard. Pulmonary:     Effort: Pulmonary effort is normal. No respiratory distress.     Breath sounds:  Normal breath sounds. No wheezing.  Musculoskeletal:     Cervical back: Neck supple.  Skin:    General: Skin is warm and dry.  Neurological:     Mental Status: He is alert. Mental status is at baseline.  Psychiatric:        Mood and Affect: Mood normal.          Assessment & Plan:   Problem List Items Addressed This Visit       Cardiovascular and Mediastinum   Essential hypertension    BP elevated at last OV and today. Start lisinopril 10 mg. Return 4 weeks. Work on exercise and weight loss.        Relevant Medications   lisinopril (ZESTRIL) 10 MG tablet     Endocrine   Type 2 diabetes mellitus with other specified complication ([place]) - Primary    Improved. Encouraged weight loss and diet adherence. Cont metformin 500 mg bid.        Relevant Medications   metFORMIN (GLUCOPHAGE) 500 MG tablet   lisinopril (ZESTRIL) 10 MG tablet   Other Relevant Orders   POCT glycosylated hemoglobin (Hb A1C) (Completed)  Musculoskeletal and Integument   Osteoarthritis of right knee    Reviewed XR from 10/2018 with arthritis. Discussed PT and referral placed. Encouraged f/u with Dr. Lorelei Pont if wanting another injection.        Relevant Orders   Ambulatory referral to Physical Therapy   Other Visit Diagnoses     Controlled type 2 diabetes mellitus without complication, without long-term current use of insulin (HCC)       Relevant Medications   metFORMIN (GLUCOPHAGE) 500 MG tablet   lisinopril (ZESTRIL) 10 MG tablet        Return in about 4 weeks (around 01/21/2021) for Blood pressure.  Lesleigh Noe, MD  This visit occurred during the SARS-CoV-2 public health emergency.  Safety protocols were in place, including screening questions prior to the visit, additional usage of staff PPE, and extensive cleaning of exam room while observing appropriate contact time as indicated for disinfecting solutions.

## 2020-12-24 NOTE — Patient Instructions (Addendum)
Your blood pressure high.   High blood pressure increases your risk for heart attack and stroke.    Please check your blood pressure 2-4 times a week.   To check your blood pressure 1) Sit in a quiet and relaxed place for 5 minutes 2) Make sure your feet are flat on the ground 3) Consider checking first thing in the morning   Normal blood pressure is less than 140/90 Ideally you blood pressure should be around 120/80  Other ways you can reduce your blood pressure:  1) Regular exercise -- Try to get 150 minutes (30 minutes, 5 days a week) of moderate to vigorous aerobic excercise -- Examples: brisk walking (2.5 miles per hour), water aerobics, dancing, gardening, tennis, biking slower than 10 miles per hour 2) DASH Diet - low fat meats, more fresh fruits and vegetables, whole grains, low salt 3) Quit smoking if you smoke 4) Loose 5-10% of your body weight   #Referral I have placed a referral to a specialist for you. You should receive a phone call from the specialty office. Make sure your voicemail is not full and that if you are able to answer your phone to unknown or new numbers.   It may take up to 2 weeks to hear about the referral. If you do not hear anything in 2 weeks, please call our office and ask to speak with the referral coordinator.

## 2020-12-24 NOTE — Assessment & Plan Note (Signed)
BP elevated at last OV and today. Start lisinopril 10 mg. Return 4 weeks. Work on exercise and weight loss.

## 2020-12-24 NOTE — Assessment & Plan Note (Signed)
Improved. Encouraged weight loss and diet adherence. Cont metformin 500 mg bid.

## 2021-01-08 ENCOUNTER — Encounter: Payer: Self-pay | Admitting: *Deleted

## 2021-01-27 DIAGNOSIS — M25561 Pain in right knee: Secondary | ICD-10-CM | POA: Diagnosis not present

## 2021-01-29 ENCOUNTER — Other Ambulatory Visit: Payer: Self-pay

## 2021-01-29 ENCOUNTER — Ambulatory Visit (INDEPENDENT_AMBULATORY_CARE_PROVIDER_SITE_OTHER): Payer: BC Managed Care – PPO | Admitting: Family Medicine

## 2021-01-29 VITALS — BP 130/90 | HR 72 | Temp 97.0°F | Ht 70.0 in | Wt 319.8 lb

## 2021-01-29 DIAGNOSIS — E785 Hyperlipidemia, unspecified: Secondary | ICD-10-CM | POA: Diagnosis not present

## 2021-01-29 DIAGNOSIS — E1169 Type 2 diabetes mellitus with other specified complication: Secondary | ICD-10-CM | POA: Diagnosis not present

## 2021-01-29 DIAGNOSIS — Z1159 Encounter for screening for other viral diseases: Secondary | ICD-10-CM

## 2021-01-29 DIAGNOSIS — I10 Essential (primary) hypertension: Secondary | ICD-10-CM | POA: Diagnosis not present

## 2021-01-29 DIAGNOSIS — Z114 Encounter for screening for human immunodeficiency virus [HIV]: Secondary | ICD-10-CM | POA: Diagnosis not present

## 2021-01-29 LAB — CBC
HCT: 40.8 % (ref 39.0–52.0)
Hemoglobin: 13.4 g/dL (ref 13.0–17.0)
MCHC: 32.9 g/dL (ref 30.0–36.0)
MCV: 87.5 fl (ref 78.0–100.0)
Platelets: 203 10*3/uL (ref 150.0–400.0)
RBC: 4.67 Mil/uL (ref 4.22–5.81)
RDW: 14.2 % (ref 11.5–15.5)
WBC: 5 10*3/uL (ref 4.0–10.5)

## 2021-01-29 LAB — LIPID PANEL
Cholesterol: 166 mg/dL (ref 0–200)
HDL: 48.9 mg/dL (ref >39.00)
LDL Cholesterol: 90 mg/dL (ref 0–99)
NonHDL: 117.02
Total CHOL/HDL Ratio: 3
Triglycerides: 133 mg/dL (ref 0.0–149.0)
VLDL: 26.6 mg/dL (ref 0.0–40.0)

## 2021-01-29 LAB — COMPREHENSIVE METABOLIC PANEL
ALT: 27 U/L (ref 0–53)
AST: 16 U/L (ref 0–37)
Albumin: 3.9 g/dL (ref 3.5–5.2)
Alkaline Phosphatase: 66 U/L (ref 39–117)
BUN: 16 mg/dL (ref 6–23)
CO2: 25 mEq/L (ref 19–32)
Calcium: 9.3 mg/dL (ref 8.4–10.5)
Chloride: 104 mEq/L (ref 96–112)
Creatinine, Ser: 0.75 mg/dL (ref 0.40–1.50)
GFR: 98.16 mL/min (ref >60.00)
Glucose, Bld: 116 mg/dL — ABNORMAL HIGH (ref 70–99)
Potassium: 4.4 mEq/L (ref 3.5–5.1)
Sodium: 137 mEq/L (ref 135–145)
Total Bilirubin: 0.6 mg/dL (ref 0.2–1.2)
Total Protein: 6.8 g/dL (ref 6.0–8.3)

## 2021-01-29 LAB — MICROALBUMIN / CREATININE URINE RATIO
Creatinine,U: 96.5 mg/dL
Microalb Creat Ratio: 0.7 mg/g (ref 0.0–30.0)
Microalb, Ur: <0.7 mg/dL (ref 0.0–1.9)

## 2021-01-29 MED ORDER — LOSARTAN POTASSIUM 50 MG PO TABS: 50.0000 mg | ORAL_TABLET | Freq: Every day | ORAL | 3 refills | Status: DC

## 2021-01-29 NOTE — Assessment & Plan Note (Signed)
Uncontrolled. Side effect to lisinopril. Stop lisinopril 10 mg. Start Losartan 50 mg. Return for nurse visit in 4 weeks (or do home monitoring). Labs today

## 2021-01-29 NOTE — Progress Notes (Signed)
Subjective:     Mike Wilson is a 60 y.o. male presenting for Follow-up (HT )     HPI  #HTN - has not been checking at home - feeling fine - getting dry cough - no cp, sob, ha, vision changes    Review of Systems  12/24/2020: Clinic - HTN - start lisinopril  Social History   Tobacco Use  Smoking Status Never  Smokeless Tobacco Never        Objective:    BP Readings from Last 3 Encounters:  01/29/21 130/90  12/24/20 (!) 148/98  08/05/20 138/90   Wt Readings from Last 3 Encounters:  01/29/21 (!) 319 lb 12 oz (145 kg)  12/24/20 (!) 322 lb (146.1 kg)  08/05/20 (!) 314 lb 8 oz (142.7 kg)    BP 130/90   Pulse 72   Temp (!) 97 F (36.1 C) (Temporal)   Ht '5\' 10"'$  (1.778 m)   Wt (!) 319 lb 12 oz (145 kg)   SpO2 98%   BMI 45.88 kg/m    Physical Exam Constitutional:      Appearance: Normal appearance. He is not ill-appearing or diaphoretic.  HENT:     Right Ear: External ear normal.     Left Ear: External ear normal.  Eyes:     General: No scleral icterus.    Extraocular Movements: Extraocular movements intact.     Conjunctiva/sclera: Conjunctivae normal.  Cardiovascular:     Rate and Rhythm: Normal rate and regular rhythm.  Pulmonary:     Effort: Pulmonary effort is normal.     Breath sounds: Normal breath sounds.  Musculoskeletal:     Cervical back: Neck supple.  Skin:    General: Skin is warm and dry.  Neurological:     Mental Status: He is alert. Mental status is at baseline.  Psychiatric:        Mood and Affect: Mood normal.          Assessment & Plan:   Problem List Items Addressed This Visit       Cardiovascular and Mediastinum   Essential hypertension - Primary    Uncontrolled. Side effect to lisinopril. Stop lisinopril 10 mg. Start Losartan 50 mg. Return for nurse visit in 4 weeks (or do home monitoring). Labs today      Relevant Medications   losartan (COZAAR) 50 MG tablet   Other Relevant Orders   Comprehensive metabolic  panel   CBC     Endocrine   Type 2 diabetes mellitus with other specified complication (HCC)   Relevant Medications   losartan (COZAAR) 50 MG tablet   Other Relevant Orders   Microalbumin / creatinine urine ratio   Hyperlipidemia associated with type 2 diabetes mellitus ([place])    Lab Results  Component Value Date   LDLCALC 105 (H) 02/21/2020  High on last check. Repeat today. Goal <70. Cont pravastatin 40 mg for now.       Relevant Medications   losartan (COZAAR) 50 MG tablet   Other Relevant Orders   Lipid panel   Other Visit Diagnoses     Need for hepatitis C screening test       Relevant Orders   Hepatitis C antibody   Encounter for screening for HIV       Relevant Orders   HIV Antibody (routine testing w rflx)        Return in about 4 weeks (around 02/26/2021) for nurse visit for bp check.  Lesleigh Noe, MD  This visit occurred during the SARS-CoV-2 public health emergency.  Safety protocols were in place, including screening questions prior to the visit, additional usage of staff PPE, and extensive cleaning of exam room while observing appropriate contact time as indicated for disinfecting solutions.

## 2021-01-29 NOTE — Assessment & Plan Note (Signed)
Lab Results  Component Value Date   LDLCALC 105 (H) 02/21/2020   High on last check. Repeat today. Goal <70. Cont pravastatin 40 mg for now.

## 2021-01-29 NOTE — Patient Instructions (Addendum)
Next colonoscopy due in March 2024  Stop Lisinopril Start Losartan 50 mg daily   Please check your blood pressure 2-4 times a week.   To check your blood pressure 1) Sit in a quiet and relaxed place for 5 minutes 2) Make sure your feet are flat on the ground 3) Consider checking first thing in the morning   Normal blood pressure is less than 140/90 Ideally you blood pressure should be around 120/80   Update via Mychart in 3-4 weeks or schedule nurse visit if unable to check at home.

## 2021-01-30 LAB — HEPATITIS C ANTIBODY
Hepatitis C Ab: NONREACTIVE
SIGNAL TO CUT-OFF: 0.01 (ref <1.00)

## 2021-01-30 LAB — HIV ANTIBODY (ROUTINE TESTING W REFLEX): HIV 1&2 Ab, 4th Generation: NONREACTIVE

## 2021-02-03 DIAGNOSIS — M25561 Pain in right knee: Secondary | ICD-10-CM | POA: Diagnosis not present

## 2021-02-05 DIAGNOSIS — M25561 Pain in right knee: Secondary | ICD-10-CM | POA: Diagnosis not present

## 2021-02-10 DIAGNOSIS — M25561 Pain in right knee: Secondary | ICD-10-CM | POA: Diagnosis not present

## 2021-02-12 ENCOUNTER — Ambulatory Visit: Payer: BC Managed Care – PPO | Admitting: Dermatology

## 2021-02-14 DIAGNOSIS — M25561 Pain in right knee: Secondary | ICD-10-CM | POA: Diagnosis not present

## 2021-02-17 DIAGNOSIS — M25561 Pain in right knee: Secondary | ICD-10-CM | POA: Diagnosis not present

## 2021-02-20 DIAGNOSIS — M25561 Pain in right knee: Secondary | ICD-10-CM | POA: Diagnosis not present

## 2021-02-24 DIAGNOSIS — M25561 Pain in right knee: Secondary | ICD-10-CM | POA: Diagnosis not present

## 2021-03-03 DIAGNOSIS — M25561 Pain in right knee: Secondary | ICD-10-CM | POA: Diagnosis not present

## 2021-03-05 DIAGNOSIS — M25561 Pain in right knee: Secondary | ICD-10-CM | POA: Diagnosis not present

## 2021-03-10 DIAGNOSIS — M25561 Pain in right knee: Secondary | ICD-10-CM | POA: Diagnosis not present

## 2021-03-13 ENCOUNTER — Other Ambulatory Visit: Payer: Self-pay

## 2021-03-13 ENCOUNTER — Ambulatory Visit: Payer: BC Managed Care – PPO

## 2021-03-13 DIAGNOSIS — I10 Essential (primary) hypertension: Secondary | ICD-10-CM

## 2021-03-13 NOTE — Progress Notes (Signed)
Per Dr. Einar Pheasant encounter order on 01/29/21, patient presents today for a nurse visit blood pressure check for ongoing follow up and management.  Vital Sign Readings today BP: 140 84, P: 79, SpO2: 96%.

## 2021-03-14 DIAGNOSIS — M25561 Pain in right knee: Secondary | ICD-10-CM | POA: Diagnosis not present

## 2021-03-17 DIAGNOSIS — M25561 Pain in right knee: Secondary | ICD-10-CM | POA: Diagnosis not present

## 2021-03-21 DIAGNOSIS — M25561 Pain in right knee: Secondary | ICD-10-CM | POA: Diagnosis not present

## 2021-03-28 DIAGNOSIS — M25561 Pain in right knee: Secondary | ICD-10-CM | POA: Diagnosis not present

## 2021-04-04 DIAGNOSIS — M25561 Pain in right knee: Secondary | ICD-10-CM | POA: Diagnosis not present

## 2021-04-07 DIAGNOSIS — M25561 Pain in right knee: Secondary | ICD-10-CM | POA: Diagnosis not present

## 2021-04-11 DIAGNOSIS — M25561 Pain in right knee: Secondary | ICD-10-CM | POA: Diagnosis not present

## 2021-04-14 DIAGNOSIS — M25561 Pain in right knee: Secondary | ICD-10-CM | POA: Diagnosis not present

## 2021-04-18 DIAGNOSIS — M25561 Pain in right knee: Secondary | ICD-10-CM | POA: Diagnosis not present

## 2021-06-13 ENCOUNTER — Other Ambulatory Visit: Payer: Self-pay | Admitting: Family Medicine

## 2021-06-13 ENCOUNTER — Telehealth: Payer: Self-pay | Admitting: Internal Medicine

## 2021-06-13 DIAGNOSIS — E119 Type 2 diabetes mellitus without complications: Secondary | ICD-10-CM

## 2021-06-13 DIAGNOSIS — E1169 Type 2 diabetes mellitus with other specified complication: Secondary | ICD-10-CM

## 2021-06-13 NOTE — Telephone Encounter (Signed)
Lm for patient.   He is past due for follow up.

## 2021-06-13 NOTE — Telephone Encounter (Signed)
Called pt to get scheduled with PCP for bp/diabetes f/up . Lvmtcb

## 2021-06-16 ENCOUNTER — Telehealth: Payer: Self-pay | Admitting: *Deleted

## 2021-06-16 ENCOUNTER — Other Ambulatory Visit: Payer: BC Managed Care – PPO

## 2021-06-16 NOTE — Telephone Encounter (Signed)
Lm x2 for patient. Will close encounter per office protocol.  Appt is needed before report can be faxed. Last seen 06/30/2019

## 2021-06-16 NOTE — Telephone Encounter (Signed)
PLEASE NOTE: All timestamps contained within this report are represented as [place] Standard Time. CONFIDENTIALTY NOTICE: This fax transmission is intended only for the addressee. It contains information that is legally privileged, confidential or otherwise protected from use or disclosure. If you are not the intended recipient, you are strictly prohibited from reviewing, disclosing, copying using or disseminating any of this information or taking any action in reliance on or regarding this information. If you have received this fax in error, please notify us immediately by telephone so that we can arrange for its return to [place]. Phone: [phone], Toll-Free: [phone], Fax: [phone] Page: 1 of 1 Call Id: [id number] [facility] - Client Nonclinical Telephone Record  [facility] Client [facility] - Client Client Site [place] Provider Waunita Schooner- MD Contact Type Call Who Is Calling Patient / Member / Family / Caregiver Caller Name Mike Wilson Caller Phone Number [phone] Patient Name Mike Wilson Patient DOB [birth year] Call Type Message Only Information Provided Reason for Call Request to [facility] Appointment Initial Comment Caller states he is to have BW done at 730am in [place]. He is not able to make that appt. He would like to cancel. Patient request to speak to RN No Additional Comment Office hours provided. Disp. Time Disposition Final User 06/15/2021 6:32:01 PM General Information Provided Yes Lynne Logan, Amy Call Closed By: Sherlynn Stalls Transaction Date/Time: 06/15/2021 6:28:30 PM (ET)

## 2021-06-17 ENCOUNTER — Other Ambulatory Visit: Payer: Self-pay | Admitting: Primary Care

## 2021-06-17 ENCOUNTER — Telehealth: Payer: Self-pay | Admitting: *Deleted

## 2021-06-17 DIAGNOSIS — E785 Hyperlipidemia, unspecified: Secondary | ICD-10-CM

## 2021-06-17 DIAGNOSIS — E1169 Type 2 diabetes mellitus with other specified complication: Secondary | ICD-10-CM

## 2021-06-17 NOTE — Telephone Encounter (Signed)
Please place future orders for lab appt.    Pt has lab appt on 06/18/21

## 2021-06-17 NOTE — Telephone Encounter (Signed)
Appt reschedule 

## 2021-06-18 ENCOUNTER — Other Ambulatory Visit (INDEPENDENT_AMBULATORY_CARE_PROVIDER_SITE_OTHER): Payer: BC Managed Care – PPO

## 2021-06-18 ENCOUNTER — Other Ambulatory Visit: Payer: Self-pay

## 2021-06-18 ENCOUNTER — Telehealth: Payer: Self-pay | Admitting: *Deleted

## 2021-06-18 ENCOUNTER — Other Ambulatory Visit: Payer: Self-pay | Admitting: Primary Care

## 2021-06-18 DIAGNOSIS — E1169 Type 2 diabetes mellitus with other specified complication: Secondary | ICD-10-CM | POA: Diagnosis not present

## 2021-06-18 DIAGNOSIS — E785 Hyperlipidemia, unspecified: Secondary | ICD-10-CM | POA: Diagnosis not present

## 2021-06-18 LAB — LIPID PANEL
Cholesterol: 138 mg/dL (ref 0–200)
HDL: 48.5 mg/dL (ref >39.00)
LDL Cholesterol: 64 mg/dL (ref 0–99)
NonHDL: 89.27
Total CHOL/HDL Ratio: 3
Triglycerides: 125 mg/dL (ref 0.0–149.0)
VLDL: 25 mg/dL (ref 0.0–40.0)

## 2021-06-18 LAB — HEMOGLOBIN A1C: Hgb A1c MFr Bld: 6.7 % — ABNORMAL HIGH (ref 4.6–6.5)

## 2021-06-18 NOTE — Telephone Encounter (Signed)
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 No A1C was ordered. And patient was not fasting for a lipid.   Please advise & place FUTURE lab orders.  Appt note also states "A1C" - appt was scheduled by [place].

## 2021-06-19 NOTE — Progress Notes (Signed)
Left VM asking for pt to return my call

## 2021-06-19 NOTE — Telephone Encounter (Signed)
[name] placed order for A1C and lab sent it off.

## 2021-06-24 NOTE — Telephone Encounter (Signed)
Noted  

## 2021-06-25 ENCOUNTER — Telehealth: Payer: Self-pay | Admitting: *Deleted

## 2021-06-25 ENCOUNTER — Other Ambulatory Visit: Payer: Self-pay

## 2021-06-25 ENCOUNTER — Telehealth (INDEPENDENT_AMBULATORY_CARE_PROVIDER_SITE_OTHER): Payer: BC Managed Care – PPO | Admitting: Adult Health

## 2021-06-25 ENCOUNTER — Encounter: Payer: Self-pay | Admitting: Adult Health

## 2021-06-25 DIAGNOSIS — G4733 Obstructive sleep apnea (adult) (pediatric): Secondary | ICD-10-CM | POA: Diagnosis not present

## 2021-06-25 NOTE — Progress Notes (Signed)
Virtual Visit via Video Note  I connected with Mike Wilson on 06/25/21 at  9:00 AM EST by a video enabled telemedicine application and verified that I am speaking with the correct person using two identifiers.  Location: Patient: Work  Provider: Office    I discussed the limitations of evaluation and management by telemedicine and the availability of in person appointments. The patient expressed understanding and agreed to proceed.  History of Present Illness: 61 year old male followed for obstructive sleep apnea  Today's video visit is a follow-up for severe sleep apnea on nocturnal CPAP.  Patient has had sleep apnea for many years.  Has been on CPAP for a long time.  Patient says he cannot live without it.  He wears his CPAP every night for at least 7 or 8 hours.  Patient feels rested with no significant daytime sleepiness.  Feels that he benefits from CPAP.  Patient is a Administrator.  Gets yearly CDL license. Patient wears a fullface mask.  Is on auto CPAP 5 to 20 cm H2O.  DME company is [employer]. CPAP no longer does automated downloads.  Patient has agreed to bring his ST card in or machine in for download to his DME company.   Past Medical History:  Diagnosis Date   Diabetes mellitus without complication ([place])      Current Outpatient Medications on File Prior to Visit  Medication Sig Dispense Refill   ibuprofen (ADVIL) 200 MG tablet Take 200 mg by mouth every 6 (six) hours as needed. 2 tabs prn     losartan (COZAAR) 50 MG tablet Take 1 tablet (50 mg total) by mouth daily. 90 tablet 3   metFORMIN (GLUCOPHAGE) 500 MG tablet Take 1 tablet (500 mg total) by mouth 2 (two) times daily with a meal. 180 tablet 3   pravastatin (PRAVACHOL) 40 MG tablet Take 1/2 (one-half) tablet by mouth once daily 30 tablet 0   No current facility-administered medications on file prior to visit.      Observations/Objective: SLEEP STUDY 2019  AHI 46  06/25/2021 appears well.  No acute  distress.  Assessment and Plan: Severe obstructive sleep apnea.  Patient education given on sleep apnea and CPAP.  Patient has excellent reported compliance.  And benefits from CPAP.  CPAP download has been requested.  Obesity.  Patient is encouraged on healthy weight loss.  Plan  Patient Instructions  Keep up the good work on CPAP. Continue to wear your CPAP all night long. Work on healthy weight loss. Do not drive if sleepy CPAP download has been requested. Follow-up in 1 year and as needed with Dr. Mortimer Fries      Follow Up Instructions:    I discussed the assessment and treatment plan with the patient. The patient was provided an opportunity to ask questions and all were answered. The patient agreed with the plan and demonstrated an understanding of the instructions.   The patient was advised to call back or seek an in-person evaluation if the symptoms worsen or if the condition fails to improve as anticipated.  I provided 20  minutes of non-face-to-face time during this encounter.   Rexene Edison, NP

## 2021-06-25 NOTE — Telephone Encounter (Signed)
Opened in error

## 2021-06-25 NOTE — Telephone Encounter (Signed)
Called [employer] and requested download.  They will fax to [phone].

## 2021-06-25 NOTE — Patient Instructions (Signed)
Keep up the good work on CPAP. Continue to wear your CPAP all night long. Work on healthy weight loss. Do not drive if sleepy CPAP download has been requested. Follow-up in 1 year and as needed with Dr. Mortimer Fries

## 2021-06-25 NOTE — Addendum Note (Signed)
Addended by: Vanessa Barbara on: 06/25/2021 10:33 AM   Modules accepted: Orders

## 2021-06-26 NOTE — Telephone Encounter (Signed)
Routing to [employer] for f/u.  Heather, have you received download?

## 2021-07-01 NOTE — Telephone Encounter (Signed)
Received download from [place], placed in [place] [facility]'s review folder.

## 2021-07-01 NOTE — Telephone Encounter (Signed)
I have looked and cannot find a download on this patient.  He does not come up in airview when I search.  I called [employer] again and spoke with Coralyn Mark.  I requested again to have our office associated with our office and to send a 30 day download to [phone].  Will await fax.

## 2021-07-02 ENCOUNTER — Encounter: Payer: Self-pay | Admitting: *Deleted

## 2021-07-02 NOTE — Telephone Encounter (Signed)
Called and spoke with patient, he verified that his wife came by last week and got a copy of his download prior to his appointment with the DOT.  Nothing further needed.

## 2021-07-15 ENCOUNTER — Telehealth: Payer: Self-pay | Admitting: Adult Health

## 2021-07-15 NOTE — Telephone Encounter (Signed)
CPAP download shows excellent compliance and control with 100% usage daily average usage at 8 hours.  Patient is on auto CPAP 5 to 20 cm H2O.  And AHI is at 1.6.  Continue on current settings of CPAP.

## 2021-07-18 ENCOUNTER — Other Ambulatory Visit: Payer: Self-pay | Admitting: Adult Health

## 2021-07-18 DIAGNOSIS — G4733 Obstructive sleep apnea (adult) (pediatric): Secondary | ICD-10-CM

## 2021-07-18 NOTE — Progress Notes (Signed)
Orders for CPAP supplies. ? ?

## 2021-07-18 NOTE — Telephone Encounter (Signed)
I called the patient and he is agreeable to the recommendations.  ?Nothing further needed.  ?

## 2021-09-09 DIAGNOSIS — Z0189 Encounter for other specified special examinations: Secondary | ICD-10-CM | POA: Diagnosis not present

## 2021-09-15 ENCOUNTER — Other Ambulatory Visit: Payer: Self-pay | Admitting: Family Medicine

## 2021-09-15 DIAGNOSIS — E1169 Type 2 diabetes mellitus with other specified complication: Secondary | ICD-10-CM

## 2021-09-15 DIAGNOSIS — E119 Type 2 diabetes mellitus without complications: Secondary | ICD-10-CM

## 2021-10-21 DIAGNOSIS — H524 Presbyopia: Secondary | ICD-10-CM | POA: Diagnosis not present

## 2021-10-21 DIAGNOSIS — E119 Type 2 diabetes mellitus without complications: Secondary | ICD-10-CM | POA: Diagnosis not present

## 2021-10-21 DIAGNOSIS — H35361 Drusen (degenerative) of macula, right eye: Secondary | ICD-10-CM | POA: Diagnosis not present

## 2021-10-21 LAB — HM DIABETES EYE EXAM

## 2021-10-29 ENCOUNTER — Encounter: Payer: Self-pay | Admitting: Family Medicine

## 2021-11-03 ENCOUNTER — Telehealth: Payer: Self-pay | Admitting: Family Medicine

## 2021-11-03 NOTE — Telephone Encounter (Signed)
[facility] was [facility]  for PCP . I was place in folder

## 2021-11-04 NOTE — Telephone Encounter (Signed)
Paperwork dropped off was not anything we needed. Re-requested [facility] eye exam from [facility].

## 2021-11-05 ENCOUNTER — Telehealth: Payer: Self-pay | Admitting: Family Medicine

## 2021-11-05 DIAGNOSIS — E1169 Type 2 diabetes mellitus with other specified complication: Secondary | ICD-10-CM

## 2021-11-05 DIAGNOSIS — E119 Type 2 diabetes mellitus without complications: Secondary | ICD-10-CM

## 2021-11-06 NOTE — Telephone Encounter (Signed)
Called patient but voicemail was not set up so could not leave a vm. Please advise thanks.   Callback Number: [phone]

## 2021-11-20 ENCOUNTER — Other Ambulatory Visit: Payer: Self-pay | Admitting: Family Medicine

## 2021-11-20 DIAGNOSIS — E1169 Type 2 diabetes mellitus with other specified complication: Secondary | ICD-10-CM

## 2021-11-20 DIAGNOSIS — E119 Type 2 diabetes mellitus without complications: Secondary | ICD-10-CM

## 2021-11-20 NOTE — Telephone Encounter (Signed)
Pt is past due for a CPE. Please schedule. Thanks.

## 2021-12-18 ENCOUNTER — Ambulatory Visit (INDEPENDENT_AMBULATORY_CARE_PROVIDER_SITE_OTHER): Payer: BC Managed Care – PPO | Admitting: Family Medicine

## 2021-12-18 VITALS — BP 144/90 | HR 74 | Temp 97.0°F | Ht 70.0 in | Wt 311.1 lb

## 2021-12-18 DIAGNOSIS — E119 Type 2 diabetes mellitus without complications: Secondary | ICD-10-CM | POA: Diagnosis not present

## 2021-12-18 DIAGNOSIS — E1169 Type 2 diabetes mellitus with other specified complication: Secondary | ICD-10-CM

## 2021-12-18 DIAGNOSIS — Z125 Encounter for screening for malignant neoplasm of prostate: Secondary | ICD-10-CM

## 2021-12-18 DIAGNOSIS — Z0000 Encounter for general adult medical examination without abnormal findings: Secondary | ICD-10-CM

## 2021-12-18 DIAGNOSIS — E785 Hyperlipidemia, unspecified: Secondary | ICD-10-CM

## 2021-12-18 DIAGNOSIS — I10 Essential (primary) hypertension: Secondary | ICD-10-CM

## 2021-12-18 DIAGNOSIS — K219 Gastro-esophageal reflux disease without esophagitis: Secondary | ICD-10-CM

## 2021-12-18 LAB — COMPREHENSIVE METABOLIC PANEL
ALT: 22 U/L (ref 0–53)
AST: 16 U/L (ref 0–37)
Albumin: 4.1 g/dL (ref 3.5–5.2)
Alkaline Phosphatase: 71 U/L (ref 39–117)
BUN: 15 mg/dL (ref 6–23)
CO2: 26 mEq/L (ref 19–32)
Calcium: 9.2 mg/dL (ref 8.4–10.5)
Chloride: 104 mEq/L (ref 96–112)
Creatinine, Ser: 0.8 mg/dL (ref 0.40–1.50)
GFR: 95.67 mL/min (ref >60.00)
Glucose, Bld: 118 mg/dL — ABNORMAL HIGH (ref 70–99)
Potassium: 4.2 mEq/L (ref 3.5–5.1)
Sodium: 137 mEq/L (ref 135–145)
Total Bilirubin: 0.6 mg/dL (ref 0.2–1.2)
Total Protein: 6.8 g/dL (ref 6.0–8.3)

## 2021-12-18 LAB — HEMOGLOBIN A1C: Hgb A1c MFr Bld: 7 % — ABNORMAL HIGH (ref 4.6–6.5)

## 2021-12-18 LAB — PSA: PSA: 0.26 ng/mL (ref 0.10–4.00)

## 2021-12-18 MED ORDER — METFORMIN HCL 500 MG PO TABS: 500.0000 mg | ORAL_TABLET | Freq: Two times a day (BID) | ORAL | 3 refills | Status: DC

## 2021-12-18 MED ORDER — PRAVASTATIN SODIUM 40 MG PO TABS: ORAL_TABLET | ORAL | 3 refills | Status: DC

## 2021-12-18 MED ORDER — OMEPRAZOLE 20 MG PO CPDR: 20.0000 mg | DELAYED_RELEASE_CAPSULE | Freq: Every day | ORAL | 3 refills | Status: DC

## 2021-12-18 MED ORDER — LOSARTAN POTASSIUM 50 MG PO TABS: 50.0000 mg | ORAL_TABLET | Freq: Every day | ORAL | 3 refills | Status: DC

## 2021-12-18 NOTE — Assessment & Plan Note (Signed)
Continue metformin, hemoglobin A1c ordered today.  Work on weight loss.

## 2021-12-18 NOTE — Progress Notes (Signed)
Annual Exam   Chief Complaint:  Chief Complaint  Patient presents with   Annual Exam   Heartburn    Increased due to metformin    History of Present Illness:  Mike Wilson is a 61 y.o. presents today for annual examination.    #heartburn - daily  - on metformin x 3-4 years - no changes in diet - eating more salads - usually notices when active  Taking losartan Does not check bp at home    Nutrition/Lifestyle Diet: working on diet, not following a diabetic diet Exercise: walk in the evenings, caring for farm animals He is single partner, contraception - post menopausal status.  Any issues with getting or keeping erection? No  Social History   Tobacco Use  Smoking Status Never  Smokeless Tobacco Never   Social History   Substance and Sexual Activity  Alcohol Use Yes   Comment: annually   Social History   Substance and Sexual Activity  Drug Use No     Safety The patient wears seatbelts: yes.     The patient feels safe at home and in their relationships: yes.  General Health Dentist in the last year: No Eye doctor: yes  Weight Wt Readings from Last 3 Encounters:  12/18/21 (!) 311 lb 2 oz (141.1 kg)  01/29/21 (!) 319 lb 12 oz (145 kg)  12/24/20 (!) 322 lb (146.1 kg)   Patient has very high BMI  BMI Readings from Last 1 Encounters:  12/18/21 44.64 kg/m     Chronic disease screening Blood pressure monitoring:  BP Readings from Last 3 Encounters:  12/18/21 (!) 144/90  01/29/21 130/90  12/24/20 (!) 148/98    Lipid Monitoring: Indication for screening: age >35, obesity, diabetes, family hx, CV risk factors.  Lipid screening: Yes  Lab Results  Component Value Date   CHOL 138 06/18/2021   HDL 48.50 06/18/2021   LDLCALC 64 06/18/2021   TRIG 125.0 06/18/2021   CHOLHDL 3 06/18/2021     Diabetes Screening: age >15, overweight, family hx, PCOS, hx of gestational diabetes, at risk ethnicity, elevated blood pressure >135/80.  Diabetes Screening  screening: Yes  Lab Results  Component Value Date   HGBA1C 6.7 (H) 06/18/2021     Prostate Cancer Screening: Yes Age 35-69 yo Shared Decision Making Higher Risk: Older age, African American, Family Hx of Prostate Cancer - Yes Benefits: screening may prevent 1.3 deaths from prostate cancer over 13 years per 1000 men screened and prevent 3 metastatic cases per 1000 men screened. Not enough evidence to support more benefit for AA or [place] Harms: False Positive and psychological harms. 15% of me with false positive over a 2 to 4 year period > resulting in biopsy and complications such as pain, hematospermia, infections. Overdiagnosis - increases with age - found that 20-50% of prostate cancer through screening may have never caused any issues. Harms of treatment include - erectile dysfunction, urinary incontinence, and bothersome bowel symptoms.   After discussion he does want to get a PSA checked today.   Inadequate evidence for screening <55 No mortality benefit for screening >70   Lab Results  Component Value Date   PSA1 0.4 02/21/2019   PSA1 0.5 10/13/2017   PSA1 0.5 03/12/2017   PSA 0.6 12/15/2016       Colon Cancer Screening:  Age 28-75 yo - benefits outweigh the risk. Adults 16-85 yo who have never been screened benefit.  Benefits: 134000 people in 2016 will be diagnosed and 49,000 will die -  early detection helps Harms: Complications 2/2 to colonoscopy High Risk (Colonoscopy): genetic disorder (Lynch syndrome or familial adenomatous polyposis), personal hx of IBD, previous adenomatous polyp, or previous colorectal cancer, FamHx start 10 years before the age at diagnosis, increased in males and black race  Options:  FIT - looks for hemoglobin (blood in the stool) - specific and fairly sensitive - must be done annually Cologuard - looks for DNA and blood - more sensitive - therefore can have more false positives, every 3 years Colonoscopy - every 10 years if normal - sedation,  bowl prep, must have someone drive you  Shared decision making and the patient had decided to do due in 2024.   Social History   Tobacco Use  Smoking Status Never  Smokeless Tobacco Never    Lung Cancer Screening (Ages 29-93): not applicable    Past Medical History:  Diagnosis Date   Diabetes mellitus without complication ([place])     Past Surgical History:  Procedure Laterality Date   APPENDECTOMY     COLONOSCOPY WITH PROPOFOL N/A 08/06/2017   Procedure: COLONOSCOPY WITH PROPOFOL;  Surgeon: Jonathon Bellows, MD;  Location: [facility] ENDOSCOPY;  Service: Gastroenterology;  Laterality: N/A;   [place]    Prior to Admission medications   Medication Sig Start Date End Date Taking? Authorizing Provider  ibuprofen (ADVIL) 200 MG tablet Take 200 mg by mouth every 6 (six) hours as needed. 2 tabs prn   Yes [provider]  losartan (COZAAR) 50 MG tablet Take 1 tablet (50 mg total) by mouth daily. 01/29/21  Yes Lesleigh Noe, MD  metFORMIN (GLUCOPHAGE) 500 MG tablet Take 1 tablet (500 mg total) by mouth 2 (two) times daily with a meal. 12/24/20  Yes Lesleigh Noe, MD  pravastatin (PRAVACHOL) 40 MG tablet Take 1/2 (one-half) tablet by mouth once daily 11/20/21  Yes Lesleigh Noe, MD    No Known Allergies   Social History   Socioeconomic History   Marital status: Married    Spouse name: Almyra Free   Number of children: 4   Years of education: some grad school   Highest education level: Not on file  Occupational History   Not on file  Tobacco Use   Smoking status: Never   Smokeless tobacco: Never  Vaping Use   Vaping Use: Never used  Substance and Sexual Activity   Alcohol use: Yes    Comment: annually   Drug use: No   Sexual activity: Yes    Partners: Female    Birth control/protection: Surgical  Other Topics Concern   Not on file  Social History Narrative   08/05/20   From: [place] originally, moved for wife's work   Living: with wife,  Almyra Free [phone]) and 3 adult children   Work: truck Geophysicist/field seismologist, for [employer]: 4 children - Nira Conn, Wille Celeste, and Roderic Palau       Enjoys: hiking, reading, video game w/ daughter      Exercise: here and there, finishing a remodel, yardwork   Diet: trading off cooking with children      Safety   Seat belts: Yes    Guns: decline   Safe in relationships: Yes    Social Determinants of Radio broadcast assistant Strain: Not on file  Food Insecurity: Not on file  Transportation Needs: Not on file  Physical Activity: Not on file  Stress: Not on file  Social Connections: Not on  file  Intimate Partner Violence: Not on file    Family History  Problem Relation Age of Onset   Diabetes Mother    Liver cancer Mother    Melanoma Father    Diabetes Sister    Prostate cancer Neg Hx    Bladder Cancer Neg Hx    Kidney cancer Neg Hx     Review of Systems  Constitutional:  Negative for chills and fever.  HENT:  Negative for congestion and sore throat.   Eyes:  Negative for blurred vision and double vision.  Respiratory:  Negative for shortness of breath.   Cardiovascular:  Negative for chest pain.  Gastrointestinal:  Positive for heartburn. Negative for nausea and vomiting.  Genitourinary: Negative.   Musculoskeletal: Negative.  Negative for myalgias.  Skin:  Negative for rash.  Neurological:  Negative for dizziness and headaches.  Endo/Heme/Allergies:  Does not bruise/bleed easily.  Psychiatric/Behavioral:  Negative for depression. The patient is not nervous/anxious.      Physical Exam BP (!) 144/90   Pulse 74   Temp (!) 97 F (36.1 C) (Temporal)   Ht '5\' 10"'$  (1.778 m)   Wt (!) 311 lb 2 oz (141.1 kg)   SpO2 99%   BMI 44.64 kg/m    BP Readings from Last 3 Encounters:  12/18/21 (!) 144/90  01/29/21 130/90  12/24/20 (!) 148/98      Physical Exam Constitutional:      General: He is not in acute distress.    Appearance: He is well-developed. He is obese. He  is not diaphoretic.  HENT:     Head: Normocephalic and atraumatic.     Right Ear: Tympanic membrane and ear canal normal.     Left Ear: Tympanic membrane and ear canal normal.     Nose: Nose normal.     Mouth/Throat:     Pharynx: Uvula midline.  Eyes:     General: No scleral icterus.    Conjunctiva/sclera: Conjunctivae normal.     Pupils: Pupils are equal, round, and reactive to light.  Cardiovascular:     Rate and Rhythm: Normal rate and regular rhythm.     Heart sounds: Normal heart sounds. No murmur heard. Pulmonary:     Effort: Pulmonary effort is normal. No respiratory distress.     Breath sounds: Normal breath sounds. No wheezing.  Abdominal:     General: Bowel sounds are normal. There is no distension.     Palpations: Abdomen is soft. There is no mass.     Tenderness: There is no abdominal tenderness. There is no guarding.  Musculoskeletal:        General: Normal range of motion.     Cervical back: Normal range of motion and neck supple.  Lymphadenopathy:     Cervical: No cervical adenopathy.  Skin:    General: Skin is warm and dry.     Capillary Refill: Capillary refill takes less than 2 seconds.  Neurological:     Mental Status: He is alert and oriented to person, place, and time.        Results:  PHQ-9:     08/05/2020    8:48 AM 02/21/2020    8:03 AM 02/24/2017    8:28 AM  Depression screen PHQ 2/9  Decreased Interest 0  0  Down, Depressed, Hopeless 0 0 0  PHQ - 2 Score 0 0 0      Assessment: 61 y.o. here for routine annual physical examination.  Plan: Problem List Items Addressed This  Visit       Cardiovascular and Mediastinum   Essential hypertension   Relevant Medications   losartan (COZAAR) 50 MG tablet   pravastatin (PRAVACHOL) 40 MG tablet   Other Relevant Orders   Comprehensive metabolic panel     Digestive   Gastroesophageal reflux disease    May be secondary to metformin, however patient is morbidly obese.  Discussed trial of  omeprazole, if symptoms persist after omeprazole trial will consider switching metformin to see if this resolves symptoms.  He is working on weight loss and healthy diet handout provided for acid reflux diet today.      Relevant Medications   omeprazole (PRILOSEC) 20 MG capsule     Endocrine   Type 2 diabetes mellitus with other specified complication (HCC)    Continue metformin, hemoglobin A1c ordered today.  Work on weight loss.      Relevant Medications   losartan (COZAAR) 50 MG tablet   metFORMIN (GLUCOPHAGE) 500 MG tablet   pravastatin (PRAVACHOL) 40 MG tablet   Other Relevant Orders   Hemoglobin A1c   Comprehensive metabolic panel   Hyperlipidemia associated with type 2 diabetes mellitus (HCC)   Relevant Medications   losartan (COZAAR) 50 MG tablet   metFORMIN (GLUCOPHAGE) 500 MG tablet   pravastatin (PRAVACHOL) 40 MG tablet   Other Visit Diagnoses     Annual physical exam    -  Primary   Controlled type 2 diabetes mellitus without complication, without long-term current use of insulin (HCC)       Relevant Medications   losartan (COZAAR) 50 MG tablet   metFORMIN (GLUCOPHAGE) 500 MG tablet   pravastatin (PRAVACHOL) 40 MG tablet   Screening for prostate cancer       Relevant Orders   PSA       Screening: -- Blood pressure screen  slightly elelvated, cont losartan -- cholesterol screening: will obtain -- Weight screening: obese: discussed management options, including lifestyle, dietary, and exercise. -- Diabetes Screening: will obtain -- Nutrition: normal - Encouraged healthy diet  The 10-year ASCVD risk score (Arnett DK, et al., 2019) is: 18.2%   Values used to calculate the score:     Age: 43 years     Sex: Male     Is Non-Hispanic African American: No     Diabetic: Yes     Tobacco smoker: No     Systolic Blood Pressure: 810 mmHg     Is BP treated: Yes     HDL Cholesterol: 48.5 mg/dL     Total Cholesterol: 138 mg/dL  -- ASA 81 mg discussed if CVD risk  >10% age 26-59 and willing to take for 10 years -- Statin therapy for Age 80-75 with CVD risk >7.5%  Psych -- Depression screening (PHQ-9): negative  Safety -- tobacco screening: not using -- alcohol screening:  low-risk usage. -- no evidence of domestic violence or intimate partner violence.  Cancer Screening -- Prostate (age 15-69) ordered -- Colon (age 27-75)  up to date -- Lung not indicated   Immunizations  There is no immunization history on file for this patient.  -- flu vaccine encouraged -- TDAP q10 years not up to date - declined -- Shingles (age >24) not up to date - declined -- PPSV-23 (19-64 with chronic disease or smoking) not up to date - declined -- Covid-19 Vaccine not up to date - declined  Encouraged regular vision and dental screening. Encouraged healthy exercise and diet.   Lesleigh Noe

## 2021-12-18 NOTE — Assessment & Plan Note (Signed)
May be secondary to metformin, however patient is morbidly obese.  Discussed trial of omeprazole, if symptoms persist after omeprazole trial will consider switching metformin to see if this resolves symptoms.  He is working on weight loss and healthy diet handout provided for acid reflux diet today.

## 2021-12-18 NOTE — Patient Instructions (Signed)
Heartburn - take omeprazole 20 mg for 2 weeks - if working continue for another 4-6 weeks  - if not working, update me  Administrator, sports for Gastroesophageal Reflux Disease, Adult When you have gastroesophageal reflux disease (GERD), the foods you eat and your eating habits are very important. Choosing the right foods can help ease your discomfort. Think about working with a food expert (dietitian) to help you make good choices. What are tips for following this plan? Reading food labels Look for foods that are low in saturated fat. Foods that may help with your symptoms include: Foods that have less than 5% of daily value (DV) of fat. Foods that have 0 grams of trans fat. Cooking Do not fry your food. Cook your food by baking, steaming, grilling, or broiling. These are all methods that do not need a lot of fat for cooking. To add flavor, try to use herbs that are low in spice and acidity. Meal planning  Choose healthy foods that are low in fat, such as: Fruits and vegetables. Whole grains. Low-fat dairy products. Lean meats, fish, and poultry. Eat small meals often instead of eating 3 large meals each day. Eat your meals slowly in a place where you are relaxed. Avoid bending over or lying down until 2-3 hours after eating. Limit high-fat foods such as fatty meats or fried foods. Limit your intake of fatty foods, such as oils, butter, and shortening. Avoid the following as told by your doctor: Foods that cause symptoms. These may be different for different people. Keep a food diary to keep track of foods that cause symptoms. Alcohol. Drinking a lot of liquid with meals. Eating meals during the 2-3 hours before bed. Lifestyle Stay at a healthy weight. Ask your doctor what weight is healthy for you. If you need to lose weight, work with your doctor to do so safely. Exercise for at least 30 minutes on 5 or more days each week, or as told by your doctor. Wear loose-fitting clothes. Do not  smoke or use any products that contain nicotine or tobacco. If you need help quitting, ask your doctor. Sleep with the head of your bed higher than your feet. Use a wedge under the mattress or blocks under the bed frame to raise the head of the bed. Chew sugar-free gum after meals. What foods should eat?  Eat a healthy, well-balanced diet of fruits, vegetables, whole grains, low-fat dairy products, lean meats, fish, and poultry. Each person is different. Foods that may cause symptoms in one person may not cause any symptoms in another person. Work with your doctor to find foods that are safe for you. The items listed above may not be a complete list of what you can eat and drink. Contact a food expert for more options. What foods should I avoid? Limiting some of these foods may help in managing the symptoms of GERD. Everyone is different. Talk with a food expert or your doctor to help you find the exact foods to avoid, if any. Fruits Any fruits prepared with added fat. Any fruits that cause symptoms. For some people, this may include citrus fruits, such as oranges, grapefruit, pineapple, and lemons. Vegetables Deep-fried vegetables. [place] fries. Any vegetables prepared with added fat. Any vegetables that cause symptoms. For some people, this may include tomatoes and tomato products, chili peppers, onions and garlic, and horseradish. Grains Pastries or quick breads with added fat. Meats and other proteins High-fat meats, such as fatty beef or pork, hot dogs,  ribs, ham, sausage, salami, and bacon. Fried meat or protein, including fried fish and fried chicken. Nuts and nut butters, in large amounts. Dairy Whole milk and chocolate milk. Sour cream. Cream. Ice cream. Cream cheese. Milkshakes. Fats and oils Butter. Margarine. Shortening. Ghee. Beverages Coffee and tea, with or without caffeine. Carbonated beverages. Sodas. Energy drinks. Fruit juice made with acidic fruits, such as orange or  grapefruit. Tomato juice. Alcoholic drinks. Sweets and desserts Chocolate and cocoa. Donuts. Seasonings and condiments Pepper. Peppermint and spearmint. Added salt. Any condiments, herbs, or seasonings that cause symptoms. For some people, this may include curry, hot sauce, or vinegar-based salad dressings. The items listed above may not be a complete list of what you should not eat and drink. Contact a food expert for more options. Questions to ask your doctor Diet and lifestyle changes are often the first steps that are taken to manage symptoms of GERD. If diet and lifestyle changes do not help, talk with your doctor about taking medicines. Where to find more information International Foundation for Gastrointestinal Disorders: aboutgerd.org Summary When you have GERD, food and lifestyle choices are very important in easing your symptoms. Eat small meals often instead of 3 large meals a day. Eat your meals slowly and in a place where you are relaxed. Avoid bending over or lying down until 2-3 hours after eating. Limit high-fat foods such as fatty meats or fried foods. This information is not intended to replace advice given to you by your health care provider. Make sure you discuss any questions you have with your health care provider. Document Revised: 11/13/2019 Document Reviewed: 11/13/2019 Elsevier Patient Education  [place].

## 2022-01-05 IMAGING — MR MR HEAD WO/W CM
13 series · 48 of 48 positions shown · IV contrast (gadavist)
Comparison: None.

CLINICAL DATA: Abnormal smell/taste sensation.

EXAM:
MRI HEAD WITHOUT AND WITH CONTRAST
TECHNIQUE: Multiplanar, multiecho pulse sequences of the brain and surrounding
structures were obtained without and with intravenous contrast.
CONTRAST:  10mL GADAVIST GADOBUTROL 1 MMOL/ML IV SOLN

[Series 5: T1 · sagittal · 5.0mm · 0.62mm/px · 1 of 25 slices shown (1 of 2)]
[im 1/25]
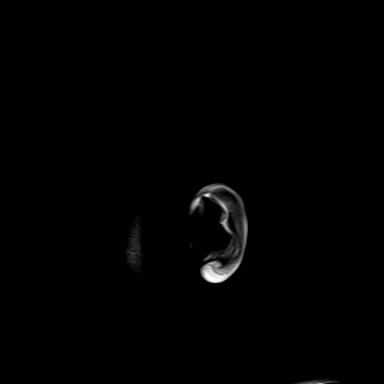

[Series 6: ax dwi_tracew · axial · 3.0mm · 0.65mm/px · z∈[-61,+91]mm · 2 of 48 slices shown]
[im 1/48]
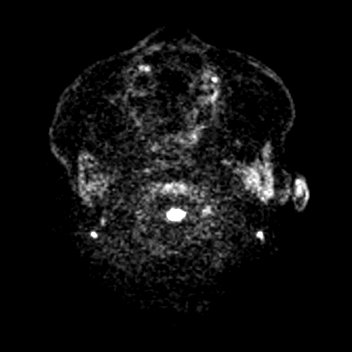
[im 48/48]
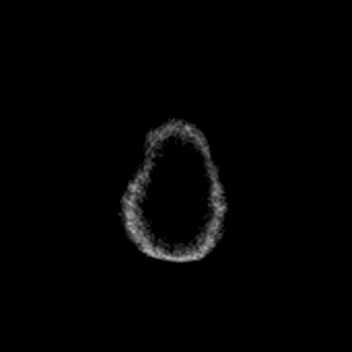

[Series 7: ax dwi_adc · axial · 3.0mm · 0.65mm/px · z∈[-61,+91]mm · 3 of 48 slices shown]
[im 1/48]
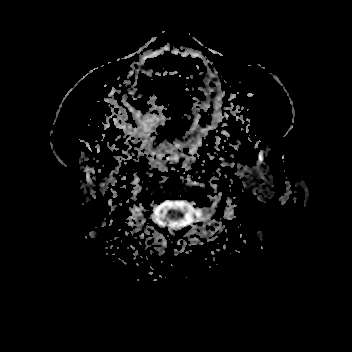
[im 24/48]
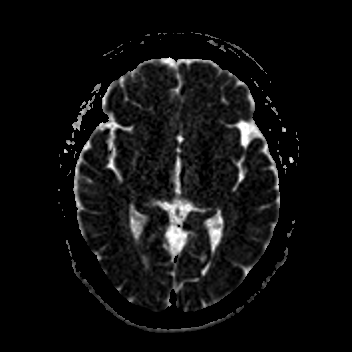
[im 48/48]
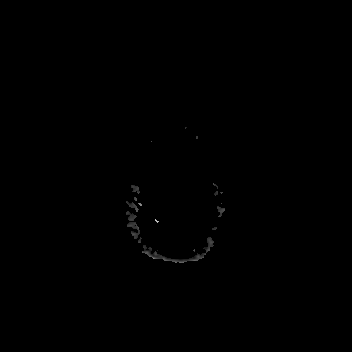

[Series 8: T2 · axial · 5.0mm · 0.53mm/px · z∈[-59,+88]mm · 2 of 26 slices shown]
[im 1/26]
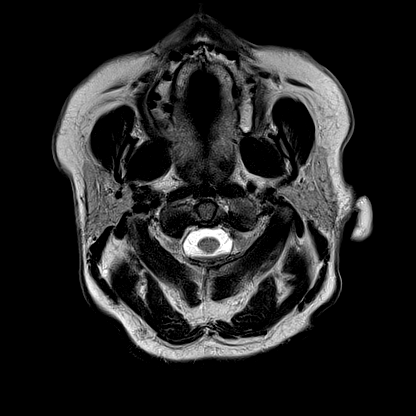
[im 26/26]
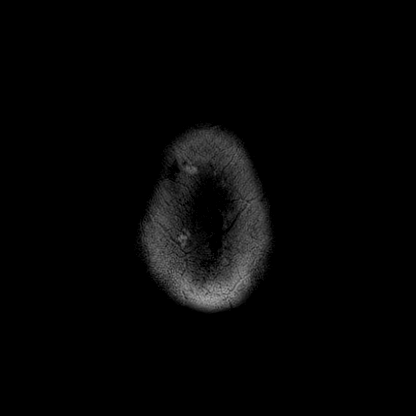

[Series 9: FLAIR · axial · 3.0mm · 0.53mm/px · z∈[-65,+94]mm · 3 of 55 slices shown]
[im 1/55]
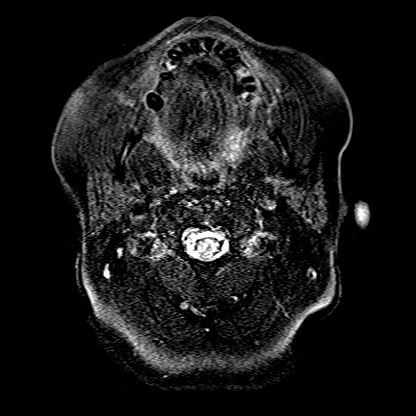
[im 28/55]
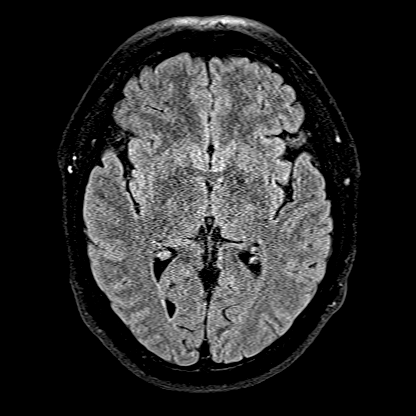
[im 55/55]
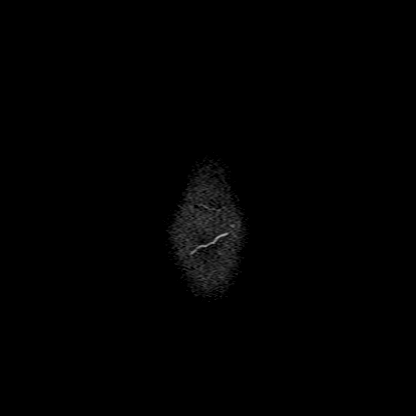

[Series 10: mag_images · axial · 3.0mm · 0.90mm/px · z∈[-70,+103]mm · 4 of 60 slices shown]
[im 1/60]
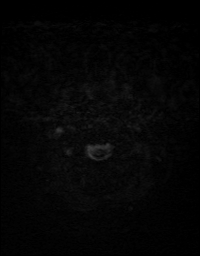
[im 20/60]
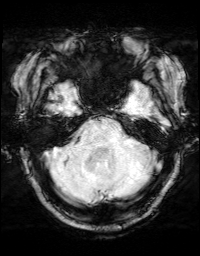
[im 40/60]
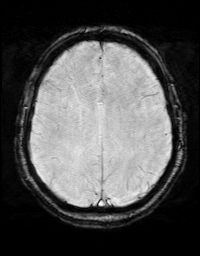
[im 60/60]
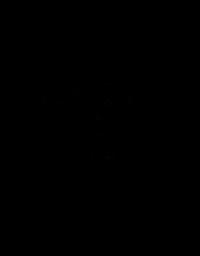

[Series 11: pha_images · axial · 3.0mm · 0.90mm/px · z∈[-67,+97]mm · 3 of 57 slices shown]
[im 1/57]
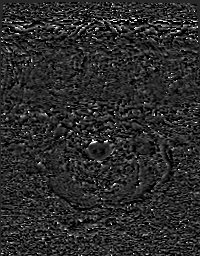
[im 29/57]
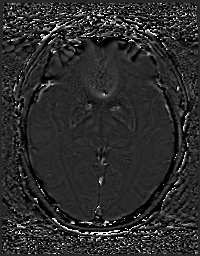
[im 57/57]
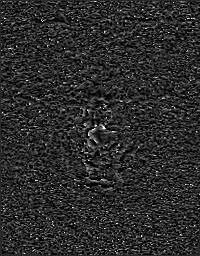

[Series 12: swi_images · axial · 3.0mm · 0.90mm/px · z∈[-70,+103]mm · 4 of 60 slices shown]
[im 1/60]
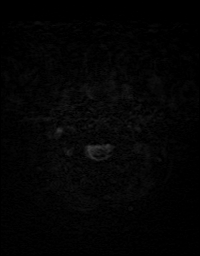
[im 20/60]
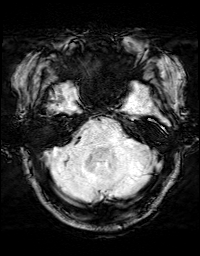
[im 40/60]
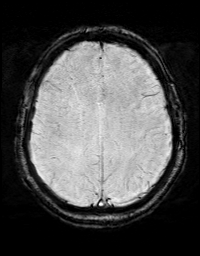
[im 60/60]
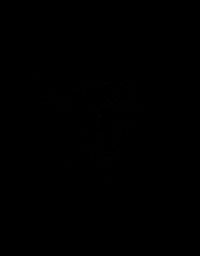

[Series 14: T1 · axial · 1.0mm · 0.98mm/px · z∈[-59,+96]mm · 10 of 160 slices shown (2 of 2)]
[im 1/160]
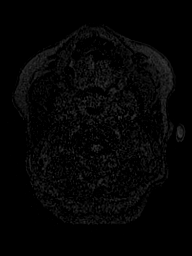
[im 18/160]
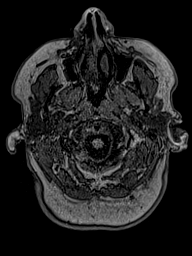
[im 36/160]
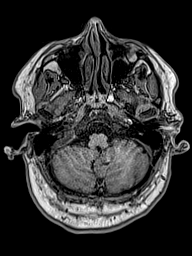
[im 54/160]
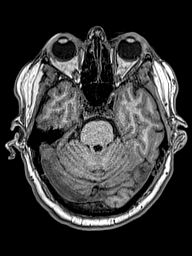
[im 71/160]
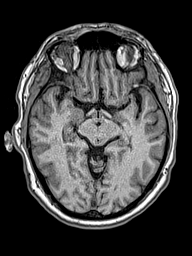
[im 89/160]
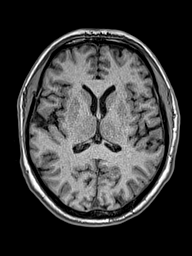
[im 107/160]
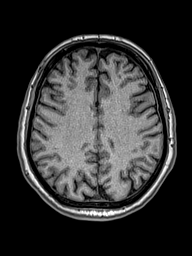
[im 124/160]
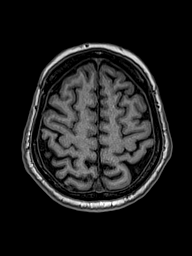
[im 142/160]
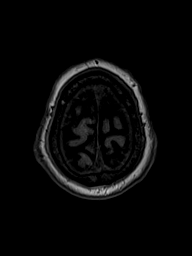
[im 160/160]
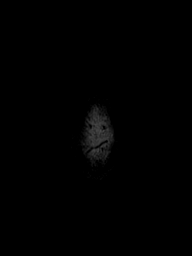

[Series 17: T2 fat-sat · axial · 3.0mm · 0.43mm/px · z∈[-70,+26]mm · 2 of 33 slices shown (1 of 2)]
[im 1/33]
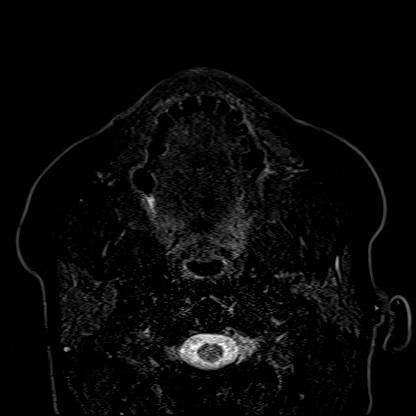
[im 33/33]
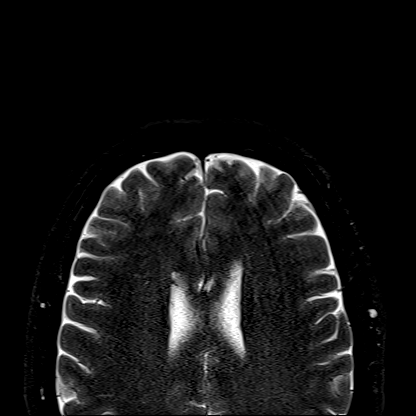

[Series 20: T2 fat-sat · coronal · 3.0mm · 0.43mm/px · 2 of 38 slices shown (2 of 2)]
[im 1/38]
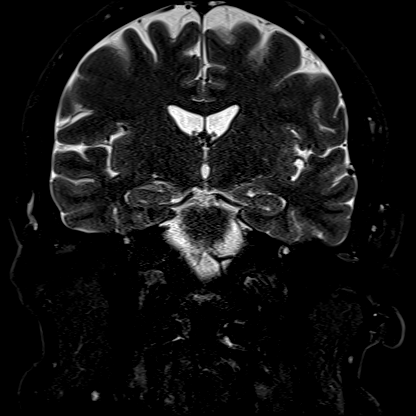
[im 38/38]
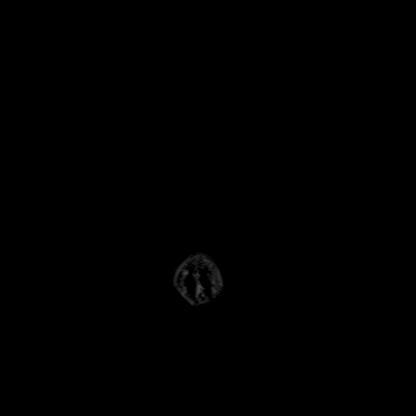

[Series 21: T1 post-contrast · axial · 1.0mm · 0.98mm/px · z∈[-59,+96]mm · 10 of 160 slices shown (1 of 2)]
[im 1/160]
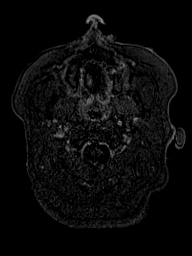
[im 18/160]
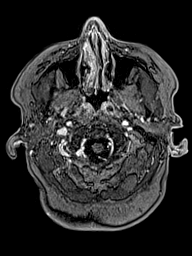
[im 36/160]
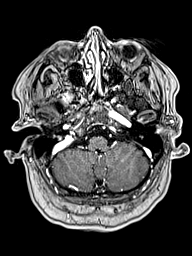
[im 54/160]
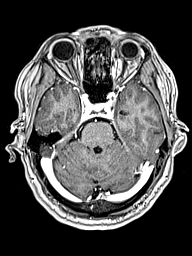
[im 71/160]
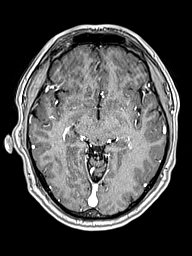
[im 89/160]
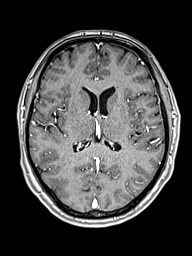
[im 107/160]
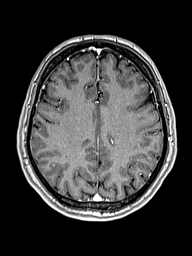
[im 124/160]
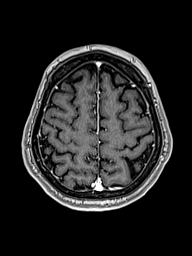
[im 142/160]
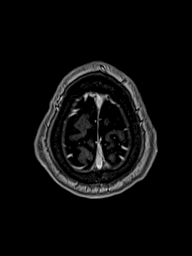
[im 160/160]
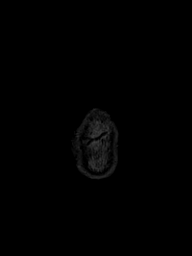

[Series 22: T1 post-contrast · coronal · 5.0mm · 0.57mm/px · 2 of 29 slices shown (2 of 2)]
[im 1/29]
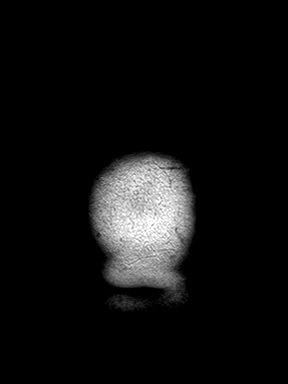
[im 29/29]
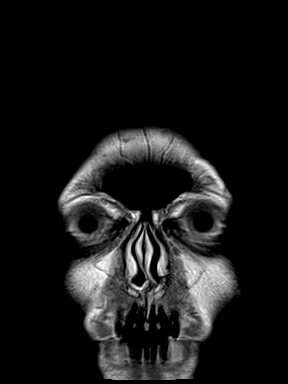

[48 of 48 positions shown; findings below may reference images not displayed]

FINDINGS: Brain: No diffusion-weighted signal abnormality. No intracranial
hemorrhage. No midline shift, ventriculomegaly or extra-axial fluid
collection. No mass lesion. No abnormal enhancement. Chronic left
cerebellar lacunar insult. Minimal chronic microvascular ischemic
changes. Normal appearance of the olfactory grooves and nerves.

Vascular: Normal flow voids.

Skull and upper cervical spine: Normal marrow signal.

Sinuses/Orbits: Normal orbits. Clear paranasal sinuses. No mastoid
effusion.

Other: None.
IMPRESSION: Normal appearance of the olfactory grooves and nerves.

Chronic left cerebellar lacunar insult.

Minimal chronic microvascular ischemic changes.

## 2022-05-15 DIAGNOSIS — G4733 Obstructive sleep apnea (adult) (pediatric): Secondary | ICD-10-CM | POA: Diagnosis not present

## 2022-05-27 ENCOUNTER — Telehealth: Payer: Self-pay | Admitting: Family Medicine

## 2022-05-27 ENCOUNTER — Telehealth: Payer: Self-pay | Admitting: Internal Medicine

## 2022-05-27 NOTE — Telephone Encounter (Signed)
Patient is scheduled to have a DOT physical completed some where else,however,he needs to come to [facility] to have his diabetic labs drawn. He would like orders placed for these labs. He is scheduled for a TOC on 07/14/22 with [facility]

## 2022-05-27 NOTE — Telephone Encounter (Signed)
90 day download has been printed and placed up front for pickup. Patient is aware and voiced his understanding.  Nothing further needed.

## 2022-05-27 NOTE — Telephone Encounter (Signed)
Patient called to request a readout of his cpap for the past 30-90 days for his DOT physical.  Please advise and call patient to discuss further at [phone]

## 2022-06-01 NOTE — Telephone Encounter (Signed)
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

## 2022-06-01 NOTE — Telephone Encounter (Signed)
Please advise. Thank you

## 2022-06-02 NOTE — Telephone Encounter (Signed)
Pt called back returning Sheena's missed call. 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 Call back # [phone]

## 2022-06-02 NOTE — Telephone Encounter (Signed)
Attempted to contact patient. No answer, recording states call cannot be completed at this time. WCB.

## 2022-06-02 NOTE — Telephone Encounter (Signed)
FYI. Thanks.

## 2022-06-03 ENCOUNTER — Other Ambulatory Visit: Payer: Self-pay | Admitting: Nurse Practitioner

## 2022-06-03 DIAGNOSIS — I10 Essential (primary) hypertension: Secondary | ICD-10-CM

## 2022-06-03 DIAGNOSIS — E1169 Type 2 diabetes mellitus with other specified complication: Secondary | ICD-10-CM

## 2022-06-03 NOTE — Progress Notes (Signed)
Patient has been scheduled

## 2022-06-03 NOTE — Progress Notes (Unsigned)
I placed lab orders. I placed a lipid order also. If he can be fasting when he comes that would be the best. He will just need to schedule a lab visit

## 2022-06-12 ENCOUNTER — Other Ambulatory Visit (INDEPENDENT_AMBULATORY_CARE_PROVIDER_SITE_OTHER): Payer: BC Managed Care – PPO

## 2022-06-12 DIAGNOSIS — E785 Hyperlipidemia, unspecified: Secondary | ICD-10-CM

## 2022-06-12 DIAGNOSIS — I10 Essential (primary) hypertension: Secondary | ICD-10-CM

## 2022-06-12 DIAGNOSIS — E1169 Type 2 diabetes mellitus with other specified complication: Secondary | ICD-10-CM | POA: Diagnosis not present

## 2022-06-12 LAB — LIPID PANEL
Cholesterol: 162 mg/dL (ref 0–200)
HDL: 49.3 mg/dL (ref >39.00)
LDL Cholesterol: 89 mg/dL (ref 0–99)
NonHDL: 112.7
Total CHOL/HDL Ratio: 3
Triglycerides: 117 mg/dL (ref 0.0–149.0)
VLDL: 23.4 mg/dL (ref 0.0–40.0)

## 2022-06-12 LAB — COMPREHENSIVE METABOLIC PANEL
ALT: 21 U/L (ref 0–53)
AST: 14 U/L (ref 0–37)
Albumin: 3.9 g/dL (ref 3.5–5.2)
Alkaline Phosphatase: 65 U/L (ref 39–117)
BUN: 16 mg/dL (ref 6–23)
CO2: 27 mEq/L (ref 19–32)
Calcium: 9.2 mg/dL (ref 8.4–10.5)
Chloride: 104 mEq/L (ref 96–112)
Creatinine, Ser: 0.73 mg/dL (ref 0.40–1.50)
GFR: 98.02 mL/min (ref >60.00)
Glucose, Bld: 131 mg/dL — ABNORMAL HIGH (ref 70–99)
Potassium: 4.2 mEq/L (ref 3.5–5.1)
Sodium: 137 mEq/L (ref 135–145)
Total Bilirubin: 0.4 mg/dL (ref 0.2–1.2)
Total Protein: 7 g/dL (ref 6.0–8.3)

## 2022-06-12 LAB — CBC
HCT: 40.2 % (ref 39.0–52.0)
Hemoglobin: 13.4 g/dL (ref 13.0–17.0)
MCHC: 33.4 g/dL (ref 30.0–36.0)
MCV: 86.7 fl (ref 78.0–100.0)
Platelets: 215 10*3/uL (ref 150.0–400.0)
RBC: 4.64 Mil/uL (ref 4.22–5.81)
RDW: 14.7 % (ref 11.5–15.5)
WBC: 6.8 10*3/uL (ref 4.0–10.5)

## 2022-06-12 LAB — HEMOGLOBIN A1C: Hgb A1c MFr Bld: 7 % — ABNORMAL HIGH (ref 4.6–6.5)

## 2022-07-14 ENCOUNTER — Encounter: Payer: BC Managed Care – PPO | Admitting: Nurse Practitioner

## 2022-08-20 ENCOUNTER — Ambulatory Visit: Payer: BC Managed Care – PPO | Admitting: Nurse Practitioner

## 2022-08-20 ENCOUNTER — Encounter: Payer: Self-pay | Admitting: Nurse Practitioner

## 2022-08-20 VITALS — BP 138/86 | HR 73 | Temp 98.3°F | Resp 16 | Ht 70.0 in | Wt 306.5 lb

## 2022-08-20 DIAGNOSIS — I10 Essential (primary) hypertension: Secondary | ICD-10-CM

## 2022-08-20 DIAGNOSIS — K219 Gastro-esophageal reflux disease without esophagitis: Secondary | ICD-10-CM

## 2022-08-20 DIAGNOSIS — E785 Hyperlipidemia, unspecified: Secondary | ICD-10-CM

## 2022-08-20 DIAGNOSIS — E1169 Type 2 diabetes mellitus with other specified complication: Secondary | ICD-10-CM | POA: Diagnosis not present

## 2022-08-20 MED ORDER — OMEPRAZOLE 20 MG PO CPDR
20.0000 mg | DELAYED_RELEASE_CAPSULE | Freq: Every day | ORAL | 1 refills | Status: AC
Start: 2022-08-20 — End: ?

## 2022-08-20 MED ORDER — PRAVASTATIN SODIUM 40 MG PO TABS: ORAL_TABLET | ORAL | 1 refills | Status: DC

## 2022-08-20 NOTE — Progress Notes (Signed)
Established Patient Office Visit  Subjective   Patient ID: Mike Wilson, male    DOB: 1961-02-10  Age: 62 y.o. MRN: [id number]  Chief Complaint  Patient presents with   Establish Care    Transferring from Dr. Einar Pheasant    HPI  Transfer of care: last visit with Waunita Schooner, MD on 12/18/2021 CPE on 12/18/2021  DM2: Patient currently maintained on metformin 500 mg twice a day.  Last A1c was in January was 7.0. does not check his sugar at home. Does not have stuff to check it at home.  No toruble with low sugars that he is aware   HTN: Patient currently maintained on losartan 50 mg daily. Does not check bp at home. Does not have a cuff  HLD: Patient currently maintained on pravastatin 40 mg daily.  GERD: Patient was having an increase in GERD with an increase in his metformin per last office visit back in 12/2021.  Patient was written omeprazole 20 mg to trial for 2 weeks to complete up to 8 weeks if beneficial. States that he has been off the omeprazole 3 weeks and using tums  Bowel habits: states that he is having a hard time controlling his bowel or bladder. States that he does have urgency and  Colonoscopy: 08/06/2017, repeat in 5 years, due 2024 PSA: 12/2021  Tdap: within 8 years  Flu: refused Covid: refused  Shingles : information given     Review of Systems  Constitutional:  Negative for chills and fever.  Respiratory:  Negative for shortness of breath.   Cardiovascular:  Negative for chest pain and leg swelling (in the evening).  Gastrointestinal:  Negative for abdominal pain, blood in stool, constipation, diarrhea, nausea and vomiting.       BM daily   Genitourinary:  Positive for urgency. Negative for dysuria and frequency.       Nocturia intermittent   Neurological:  Negative for tingling and headaches.  Psychiatric/Behavioral:  Negative for hallucinations and suicidal ideas.       Objective:     BP 138/86   Pulse 73   Temp 98.3 F (36.8 C)   Resp 16    Ht 5\' 10"  (1.778 m)   Wt (!) 306 lb 8 oz (139 kg)   SpO2 99%   BMI 43.98 kg/m  BP Readings from Last 3 Encounters:  08/20/22 138/86  12/18/21 (!) 144/90  01/29/21 130/90   Wt Readings from Last 3 Encounters:  08/20/22 (!) 306 lb 8 oz (139 kg)  12/18/21 (!) 311 lb 2 oz (141.1 kg)  01/29/21 (!) 319 lb 12 oz (145 kg)      Physical Exam Vitals and nursing note reviewed.  Constitutional:      Appearance: Normal appearance.  HENT:     Right Ear: Tympanic membrane, ear canal and external ear normal.     Left Ear: Tympanic membrane, ear canal and external ear normal.     Mouth/Throat:     Mouth: Mucous membranes are moist.     Pharynx: Oropharynx is clear.  Eyes:     Extraocular Movements: Extraocular movements intact.     Pupils: Pupils are equal, round, and reactive to light.  Cardiovascular:     Rate and Rhythm: Normal rate and regular rhythm.     Pulses: Normal pulses.          Dorsalis pedis pulses are 2+ on the right side and 2+ on the left side.       Posterior tibial  pulses are 2+ on the right side and 2+ on the left side.     Heart sounds: Normal heart sounds.  Pulmonary:     Effort: Pulmonary effort is normal.     Breath sounds: Normal breath sounds.  Musculoskeletal:     Right lower leg: Edema (R>L) present.     Left lower leg: Edema present.       Feet:  Feet:     Right foot:     Skin integrity: Dry skin present.     Toenail Condition: Right toenails are long.     Left foot:     Skin integrity: Dry skin present.     Toenail Condition: Left toenails are long.  Lymphadenopathy:     Cervical: No cervical adenopathy.  Skin:    General: Skin is warm.  Neurological:     General: No focal deficit present.     Mental Status: He is alert.     Deep Tendon Reflexes:     Reflex Scores:      Bicep reflexes are 2+ on the right side and 2+ on the left side.      Patellar reflexes are 2+ on the right side and 2+ on the left side.    Comments: Bilateral upper and  lower extremity strength 5/5  Psychiatric:        Mood and Affect: Mood normal.        Behavior: Behavior normal.        Thought Content: Thought content normal.        Judgment: Judgment normal.      No results found for any visits on 08/20/22.    The 10-year ASCVD risk score (Arnett DK, et al., 2019) is: 19.2%    Assessment & Plan:   Problem List Items Addressed This Visit       Cardiovascular and Mediastinum   Essential hypertension    Patient currently maintained on losartan 50 mg daily.  Taking medication as prescribed.  Blood pressure upon recheck was within normal limits.  Continue medication as prescribed      Relevant Medications   pravastatin (PRAVACHOL) 40 MG tablet     Digestive   Gastroesophageal reflux disease    Patient is been omeprazole 20 mg daily with benefit.  States he ran out for approximately 3 weeks and has been doing Tums 3 times a day.  Will refill omeprazole 20 mg daily stop using Tums at that level      Relevant Medications   omeprazole (PRILOSEC) 20 MG capsule     Endocrine   Type 2 diabetes mellitus with other specified complication - Primary    Patient currently maintained on metformin 500 mg twice daily.  Does not check glucose at home.  Last A1c 7.0.  Patient has lost a little bit of weight since last office it.  Continue medication as prescribed      Relevant Medications   pravastatin (PRAVACHOL) 40 MG tablet   Hyperlipidemia associated with type 2 diabetes mellitus    Patient currently maintained on Pravachol 40 mg half tablet daily.  Did check lipids in January and they are within normal limits.  Continue medication as prescribed      Relevant Medications   pravastatin (PRAVACHOL) 40 MG tablet    Return in about 4 months (around 12/20/2022) for CPE and Labs, DM recheck.    Romilda Garret, NP

## 2022-08-20 NOTE — Assessment & Plan Note (Signed)
Patient currently maintained on metformin 500 mg twice daily.  Does not check glucose at home.  Last A1c 7.0.  Patient has lost a little bit of weight since last office it.  Continue medication as prescribed

## 2022-08-20 NOTE — Patient Instructions (Signed)
Nice to see you today I have refilled the requested medications I want to see you in 4 months for your physical and more labs, sooner if you need me

## 2022-08-20 NOTE — Assessment & Plan Note (Signed)
Patient currently maintained on Pravachol 40 mg half tablet daily.  Did check lipids in January and they are within normal limits.  Continue medication as prescribed

## 2022-08-20 NOTE — Assessment & Plan Note (Signed)
Patient currently maintained on losartan 50 mg daily.  Taking medication as prescribed.  Blood pressure upon recheck was within normal limits.  Continue medication as prescribed

## 2022-08-20 NOTE — Assessment & Plan Note (Signed)
Patient is been omeprazole 20 mg daily with benefit.  States he ran out for approximately 3 weeks and has been doing Tums 3 times a day.  Will refill omeprazole 20 mg daily stop using Tums at that level

## 2022-10-24 DIAGNOSIS — H524 Presbyopia: Secondary | ICD-10-CM | POA: Diagnosis not present

## 2022-10-24 DIAGNOSIS — H2513 Age-related nuclear cataract, bilateral: Secondary | ICD-10-CM | POA: Diagnosis not present

## 2022-10-24 DIAGNOSIS — E119 Type 2 diabetes mellitus without complications: Secondary | ICD-10-CM | POA: Diagnosis not present

## 2022-10-27 ENCOUNTER — Encounter: Payer: Self-pay | Admitting: Nurse Practitioner

## 2022-10-27 NOTE — Telephone Encounter (Signed)
I do not see a referral and when I look in the chart all I see is pt seen GI in 2019

## 2022-11-18 LAB — HM DIABETES EYE EXAM

## 2022-12-22 ENCOUNTER — Ambulatory Visit (INDEPENDENT_AMBULATORY_CARE_PROVIDER_SITE_OTHER): Payer: BC Managed Care – PPO | Admitting: Nurse Practitioner

## 2022-12-22 ENCOUNTER — Encounter: Payer: Self-pay | Admitting: Nurse Practitioner

## 2022-12-22 VITALS — BP 142/100 | HR 68 | Temp 97.6°F | Ht 69.0 in | Wt 315.2 lb

## 2022-12-22 DIAGNOSIS — I10 Essential (primary) hypertension: Secondary | ICD-10-CM | POA: Diagnosis not present

## 2022-12-22 DIAGNOSIS — E1169 Type 2 diabetes mellitus with other specified complication: Secondary | ICD-10-CM | POA: Diagnosis not present

## 2022-12-22 DIAGNOSIS — Z125 Encounter for screening for malignant neoplasm of prostate: Secondary | ICD-10-CM

## 2022-12-22 DIAGNOSIS — E785 Hyperlipidemia, unspecified: Secondary | ICD-10-CM | POA: Diagnosis not present

## 2022-12-22 DIAGNOSIS — Z0000 Encounter for general adult medical examination without abnormal findings: Secondary | ICD-10-CM | POA: Diagnosis not present

## 2022-12-22 DIAGNOSIS — Z1211 Encounter for screening for malignant neoplasm of colon: Secondary | ICD-10-CM

## 2022-12-22 DIAGNOSIS — Z7984 Long term (current) use of oral hypoglycemic drugs: Secondary | ICD-10-CM

## 2022-12-22 LAB — PSA: PSA: 0.72 ng/mL (ref 0.10–4.00)

## 2022-12-22 LAB — LIPID PANEL
Cholesterol: 162 mg/dL (ref 0–200)
HDL: 50.3 mg/dL (ref >39.00)
LDL Cholesterol: 87 mg/dL (ref 0–99)
NonHDL: 111.89
Total CHOL/HDL Ratio: 3
Triglycerides: 126 mg/dL (ref 0.0–149.0)
VLDL: 25.2 mg/dL (ref 0.0–40.0)

## 2022-12-22 LAB — COMPREHENSIVE METABOLIC PANEL
ALT: 20 U/L (ref 0–53)
AST: 16 U/L (ref 0–37)
Albumin: 4.1 g/dL (ref 3.5–5.2)
Alkaline Phosphatase: 78 U/L (ref 39–117)
BUN: 13 mg/dL (ref 6–23)
CO2: 25 mEq/L (ref 19–32)
Calcium: 9.2 mg/dL (ref 8.4–10.5)
Chloride: 104 mEq/L (ref 96–112)
Creatinine, Ser: 0.77 mg/dL (ref 0.40–1.50)
GFR: 96.1 mL/min (ref >60.00)
Glucose, Bld: 133 mg/dL — ABNORMAL HIGH (ref 70–99)
Potassium: 4 mEq/L (ref 3.5–5.1)
Sodium: 139 mEq/L (ref 135–145)
Total Bilirubin: 0.5 mg/dL (ref 0.2–1.2)
Total Protein: 7.1 g/dL (ref 6.0–8.3)

## 2022-12-22 LAB — TSH: TSH: 2.4 u[IU]/mL (ref 0.35–5.50)

## 2022-12-22 LAB — CBC
HCT: 42.4 % (ref 39.0–52.0)
Hemoglobin: 13.5 g/dL (ref 13.0–17.0)
MCHC: 31.8 g/dL (ref 30.0–36.0)
MCV: 87.6 fl (ref 78.0–100.0)
Platelets: 213 10*3/uL (ref 150.0–400.0)
RBC: 4.84 Mil/uL (ref 4.22–5.81)
RDW: 14.8 % (ref 11.5–15.5)
WBC: 6.3 10*3/uL (ref 4.0–10.5)

## 2022-12-22 LAB — HEMOGLOBIN A1C: Hgb A1c MFr Bld: 7 % — ABNORMAL HIGH (ref 4.6–6.5)

## 2022-12-22 LAB — MICROALBUMIN / CREATININE URINE RATIO
Creatinine,U: 114.6 mg/dL
Microalb Creat Ratio: 0.6 mg/g (ref 0.0–30.0)
Microalb, Ur: <0.7 mg/dL (ref 0.0–1.9)

## 2022-12-22 MED ORDER — LOSARTAN POTASSIUM 50 MG PO TABS
50.0000 mg | ORAL_TABLET | Freq: Every day | ORAL | 3 refills | Status: DC
Start: 2022-12-22 — End: 2024-03-06

## 2022-12-22 NOTE — Assessment & Plan Note (Signed)
Patient per currently maintained on pravastatin 40 mg.  Pending lipid panel today continue medication as prescribed

## 2022-12-22 NOTE — Patient Instructions (Signed)
Nice to see you today I will be in touch with the labs once I have them Follow up with me in 6 months, sooner if you need me 

## 2022-12-22 NOTE — Assessment & Plan Note (Signed)
Patient currently maintained on metformin 500 mg twice daily last A1c 7.0.  Pending today.  Detailed foot exam performed today

## 2022-12-22 NOTE — Progress Notes (Signed)
Established Patient Office Visit  Subjective   Patient ID: Mike Wilson, male    DOB: [birth year]  Age: 62 y.o. MRN: [id number]  Chief Complaint  Patient presents with   Annual Exam    Non fasting.    Medication Refill    Losartan. Been out a week.       HTN: losatan 50mg  patient tolerates medication well.  States has been on occasion for approximately 1 week.  States he does not check blood pressure at home.   DM2: state that he does not check sugar at home. States that he has the equipment.  The equipment is likely old.  Patient currently maintained on metformin and tolerates it well.  He denies any episodes of hypoglycemia.  HLD: tolderates pravstatin well  for complete physical and follow up of chronic conditions.  Immunizations: -Tetanus: Completed in 8 years -Influenza: refused -Shingles: refused -Pneumonia: refused   Diet: Fair diet. States that he is a grazer and will have 3 meals. Will snack on nuts and fruits. Coffee water and unsweet tea.  Exercise: No regular exercise. 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 Wears glasses  Dental exam: needs updating    Colonoscopy: Completed in 2019 recall 5 years Lung Cancer Screening: N/A  PSA: Due  Sleep: states that he will go to bed and get 7-8 hours at night and uses the CPAP.       Review of Systems  Constitutional:  Negative for chills and fever.  Respiratory:  Negative for shortness of breath.   Cardiovascular:  Negative for chest pain and leg swelling.  Gastrointestinal:  Negative for abdominal pain, blood in stool, constipation, diarrhea, nausea and vomiting.       Bm daily   Genitourinary:  Positive for urgency. Negative for dysuria and hematuria.       Nocturia intermittently and once   Neurological:  Positive for tingling (finger or toes with long days). Negative for headaches.  Psychiatric/Behavioral:  Negative for hallucinations and suicidal ideas.        Objective:     BP (!) 142/100   Pulse 68   Temp 97.6 F (36.4 C) (Temporal)   Ht 5\' 9"  (1.753 m)   Wt (!) 315 lb 3.2 oz (143 kg)   SpO2 98%   BMI 46.55 kg/m  BP Readings from Last 3 Encounters:  12/22/22 (!) 142/100  08/20/22 138/86  12/18/21 (!) 144/90   Wt Readings from Last 3 Encounters:  12/22/22 (!) 315 lb 3.2 oz (143 kg)  08/20/22 (!) 306 lb 8 oz (139 kg)  12/18/21 (!) 311 lb 2 oz (141.1 kg)      Physical Exam Vitals and nursing note reviewed.  Constitutional:      Appearance: Normal appearance.  HENT:     Right Ear: Tympanic membrane, ear canal and external ear normal.     Left Ear: Tympanic membrane, ear canal and external ear normal.     Mouth/Throat:     Mouth: Mucous membranes are moist.     Pharynx: Oropharynx is clear.  Eyes:     Extraocular Movements: Extraocular movements intact.     Pupils: Pupils are equal, round, and reactive to light.  Cardiovascular:     Rate and Rhythm: Normal rate and regular rhythm.     Pulses: Normal pulses.     Heart sounds: Normal heart sounds.  Pulmonary:     Effort: Pulmonary effort is normal.  Breath sounds: Normal breath sounds.  Abdominal:     General: Bowel sounds are normal. There is no distension.     Palpations: There is no mass.     Tenderness: There is no abdominal tenderness.     Hernia: No hernia is present.  Genitourinary:    Comments: Deferred  Musculoskeletal:     Right lower leg: No edema.     Left lower leg: No edema.  Lymphadenopathy:     Cervical: No cervical adenopathy.  Skin:    General: Skin is warm.  Neurological:     General: No focal deficit present.     Mental Status: He is alert.     Deep Tendon Reflexes:     Reflex Scores:      Bicep reflexes are 2+ on the right side and 2+ on the left side.      Patellar reflexes are 2+ on the right side and 2+ on the left side.    Comments: Bilateral upper and lower extremity strength 5/5  Psychiatric:        Mood and Affect: Mood normal.         Behavior: Behavior normal.        Thought Content: Thought content normal.        Judgment: Judgment normal.    Diabetic Foot Form - Detailed   Diabetic Foot Exam - detailed Is there swelling or and abnormal foot shape?: No Is there a claw toe deformity?: No Is there elevated skin temparature?: No Pulse Foot Exam completed.: Yes   Right posterior Tibialias: Present Left posterior Tibialias: Present   Right Dorsalis Pedis: Present Left Dorsalis Pedis: Present  Sensory Foot Exam Completed.: Yes Semmes-Weinstein Monofilament Test   Comments: Site 9 absent on bilateral feet due to callus formation        No results found for any visits on 12/22/22.    The 10-year ASCVD risk score (Arnett DK, et al., 2019) is: 21.8%    Assessment & Plan:   Problem List Items Addressed This Visit       Cardiovascular and Mediastinum   Essential hypertension    Patient currently maintained on losartan 50 mg.  He has been medication for a week previous office visit blood pressure was well-controlled refill provided of medication today.      Relevant Medications   losartan (COZAAR) 50 MG tablet     Endocrine   Type 2 diabetes mellitus with other specified complication (HCC)    Patient currently maintained on metformin 500 mg twice daily last A1c 7.0.  Pending today.  Detailed foot exam performed today      Relevant Medications   losartan (COZAAR) 50 MG tablet   Other Relevant Orders   Hemoglobin A1c   Microalbumin / creatinine urine ratio   Hyperlipidemia associated with type 2 diabetes mellitus (HCC)    Patient per currently maintained on pravastatin 40 mg.  Pending lipid panel today continue medication as prescribed      Relevant Medications   losartan (COZAAR) 50 MG tablet   Other Relevant Orders   Lipid panel     Other   Preventative health care - Primary    Discussed age-appropriate immunizations and screening exams.  Did review patient's personal, surgical, social,  family histories.  Patient is up-to-date on all age-appropriate vaccinations he would like.  We discussed pneumonia and shingles vaccine in office.  Patient referred for CRC screening PSA for prostate cancer screening today.  Patient was given information at  discharge about preventative healthcare maintenance with anticipatory guidance.      Relevant Orders   Comprehensive metabolic panel   CBC   TSH   Other Visit Diagnoses     Screening for prostate cancer       Relevant Orders   PSA   Screening for colon cancer       Relevant Orders   Ambulatory referral to Gastroenterology       Return in about 6 months (around 06/24/2023) for DM recheck.    Audria Nine, NP

## 2022-12-22 NOTE — Assessment & Plan Note (Signed)
Patient currently maintained on losartan 50 mg.  He has been medication for a week previous office visit blood pressure was well-controlled refill provided of medication today.

## 2022-12-22 NOTE — Assessment & Plan Note (Signed)
Discussed age-appropriate immunizations and screening exams.  Did review patient's personal, surgical, social, family histories.  Patient is up-to-date on all age-appropriate vaccinations he would like.  We discussed pneumonia and shingles vaccine in office.  Patient referred for CRC screening PSA for prostate cancer screening today.  Patient was given information at discharge about preventative healthcare maintenance with anticipatory guidance.

## 2022-12-28 ENCOUNTER — Encounter: Payer: Self-pay | Admitting: *Deleted

## 2023-01-25 ENCOUNTER — Other Ambulatory Visit: Payer: Self-pay | Admitting: Nurse Practitioner

## 2023-01-25 DIAGNOSIS — E119 Type 2 diabetes mellitus without complications: Secondary | ICD-10-CM

## 2023-01-25 MED ORDER — METFORMIN HCL 500 MG PO TABS: 500.0000 mg | ORAL_TABLET | Freq: Two times a day (BID) | ORAL | 3 refills | Status: DC

## 2023-02-11 ENCOUNTER — Other Ambulatory Visit: Payer: Self-pay | Admitting: Nurse Practitioner

## 2023-02-11 DIAGNOSIS — K219 Gastro-esophageal reflux disease without esophagitis: Secondary | ICD-10-CM

## 2023-02-12 ENCOUNTER — Other Ambulatory Visit: Payer: Self-pay | Admitting: Nurse Practitioner

## 2023-02-12 DIAGNOSIS — E1169 Type 2 diabetes mellitus with other specified complication: Secondary | ICD-10-CM

## 2023-04-30 DIAGNOSIS — G4733 Obstructive sleep apnea (adult) (pediatric): Secondary | ICD-10-CM | POA: Diagnosis not present

## 2023-05-13 ENCOUNTER — Other Ambulatory Visit: Payer: Self-pay | Admitting: Nurse Practitioner

## 2023-05-13 DIAGNOSIS — E1169 Type 2 diabetes mellitus with other specified complication: Secondary | ICD-10-CM

## 2023-05-24 ENCOUNTER — Other Ambulatory Visit: Payer: Self-pay | Admitting: Nurse Practitioner

## 2023-05-24 DIAGNOSIS — K219 Gastro-esophageal reflux disease without esophagitis: Secondary | ICD-10-CM

## 2023-06-02 ENCOUNTER — Other Ambulatory Visit: Payer: Self-pay | Admitting: Nurse Practitioner

## 2023-06-02 DIAGNOSIS — E1169 Type 2 diabetes mellitus with other specified complication: Secondary | ICD-10-CM

## 2023-06-03 ENCOUNTER — Telehealth: Payer: Self-pay

## 2023-06-03 ENCOUNTER — Other Ambulatory Visit: Payer: BC Managed Care – PPO

## 2023-06-03 DIAGNOSIS — E1169 Type 2 diabetes mellitus with other specified complication: Secondary | ICD-10-CM | POA: Diagnosis not present

## 2023-06-03 LAB — HEMOGLOBIN A1C: Hgb A1c MFr Bld: 7.3 % — ABNORMAL HIGH (ref 4.6–6.5)

## 2023-06-03 NOTE — Telephone Encounter (Signed)
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  Pt stated that he needs copy of his current A1C.  A1c ppw is at front office for pick up. Pt stated he will have his daughter Haeden Marchak pick the form up.

## 2023-06-07 NOTE — Telephone Encounter (Signed)
Patients daughter picked up ppwk

## 2023-06-09 ENCOUNTER — Telehealth: Payer: Self-pay | Admitting: Internal Medicine

## 2023-06-09 NOTE — Telephone Encounter (Signed)
Compliance report has been printed and placed at the front desk for the patient to pick up.  ATC the patient. The phone rang but I did not get a voicemail. I will try again later.

## 2023-06-09 NOTE — Telephone Encounter (Signed)
Patient states needs CPAP readings for last 90 days. For DOT physical. Patient will pick up at the office. Patient phone number is [phone].

## 2023-06-09 NOTE — Telephone Encounter (Signed)
Lm x1 for the patient.

## 2023-06-11 NOTE — Telephone Encounter (Signed)
I have tried to contact the patient multiple times. I do not get an answer. The compliance report is at the front desk for the patient to pick up.  Nothing further needed.

## 2023-06-24 ENCOUNTER — Encounter: Payer: Self-pay | Admitting: Nurse Practitioner

## 2023-06-24 ENCOUNTER — Ambulatory Visit: Payer: BC Managed Care – PPO | Admitting: Nurse Practitioner

## 2023-06-24 VITALS — BP 126/88 | HR 65 | Temp 97.6°F | Ht 69.0 in | Wt 309.6 lb

## 2023-06-24 DIAGNOSIS — Z7984 Long term (current) use of oral hypoglycemic drugs: Secondary | ICD-10-CM | POA: Diagnosis not present

## 2023-06-24 DIAGNOSIS — I10 Essential (primary) hypertension: Secondary | ICD-10-CM | POA: Diagnosis not present

## 2023-06-24 DIAGNOSIS — E1169 Type 2 diabetes mellitus with other specified complication: Secondary | ICD-10-CM

## 2023-06-24 NOTE — Assessment & Plan Note (Signed)
 Currently maintained on losartan  50mg  daily. BP WNL. Continue medication as prescribed

## 2023-06-24 NOTE — Progress Notes (Signed)
   Established Patient Office Visit  Subjective   Patient ID: Mike Wilson, male    DOB: Jun 11, 1960  Age: 63 y.o. MRN: [id number]  Chief Complaint  Patient presents with   Diabetes      DM2: patient is currently on metformin . He does not check his glucose at home. States that he does have the supplies at home. States that he got food poisioning last week. States that he did pretty good over the holidays. States that he did a few sweets but not too much. He is mindful of what he takes in sugar wise.  Soda once every 2 weeks. Water is the main source. States that he is being active with a garage and is working with that.  Also does a lot of activity at work.    HTN: patient is currently on losartan  50mg  daily. He is tolerating the medication well. He does not check his blood pressure at home.     Review of Systems  Constitutional:  Negative for chills and fever.  Respiratory:  Negative for shortness of breath.   Cardiovascular:  Negative for chest pain.  Neurological:  Negative for headaches.      Objective:     BP 126/88   Pulse 65   Temp 97.6 F (36.4 C) (Oral)   Ht 5' 9 (1.753 m)   Wt (!) 309 lb 9.6 oz (140.4 kg)   SpO2 98%   BMI 45.72 kg/m  BP Readings from Last 3 Encounters:  06/24/23 126/88  12/22/22 (!) 142/100  08/20/22 138/86   Wt Readings from Last 3 Encounters:  06/24/23 (!) 309 lb 9.6 oz (140.4 kg)  12/22/22 (!) 315 lb 3.2 oz (143 kg)  08/20/22 (!) 306 lb 8 oz (139 kg)   SpO2 Readings from Last 3 Encounters:  06/24/23 98%  12/22/22 98%  08/20/22 99%      Physical Exam Vitals and nursing note reviewed.  Constitutional:      Appearance: Normal appearance.  Cardiovascular:     Rate and Rhythm: Normal rate and regular rhythm.     Heart sounds: Normal heart sounds.  Pulmonary:     Effort: Pulmonary effort is normal.     Breath sounds: Normal breath sounds.  Abdominal:     General: Bowel sounds are normal.  Neurological:     Mental Status: He  is alert.      No results found for any visits on 06/24/23.    The 10-year ASCVD risk score (Arnett DK, et al., 2019) is: 17.8%    Assessment & Plan:   Problem List Items Addressed This Visit       Cardiovascular and Mediastinum   Essential hypertension   Currently maintained on losartan  50mg  daily. BP WNL. Continue medication as prescribed         Endocrine   Type 2 diabetes mellitus with other specified complication (HCC) - Primary   Patient currently maintained on metformin  500 mg twice daily.  Does not check glucose at home.  A1c elevated 7.3%.  Patient would like to change 12,*modifications.  Continue metformin  500 mg twice daily.  Work on healthy lifestyle modifications inclusive of diet and exercise.       Return in about 3 months (around 09/21/2023) for DM recheck.    Adina Crandall, NP

## 2023-06-24 NOTE — Assessment & Plan Note (Signed)
 Patient currently maintained on metformin  500 mg twice daily.  Does not check glucose at home.  A1c elevated 7.3%.  Patient would like to change 12,*modifications.  Continue metformin  500 mg twice daily.  Work on healthy lifestyle modifications inclusive of diet and exercise.

## 2023-06-24 NOTE — Patient Instructions (Signed)
 Nice to see you today I want to see you in 3 months for a recheck, sooner if you need me

## 2023-07-19 ENCOUNTER — Telehealth: Payer: Self-pay

## 2023-07-19 NOTE — Telephone Encounter (Signed)
 Pt requesting call back to schedule colonoscopy.

## 2023-07-20 NOTE — Telephone Encounter (Signed)
 Spoken to patient and he would like to schedule his colonoscopy but he is having some issues that he did not wanted to say out loud at where he was at.  Please call patient to schedule an appointment.

## 2023-07-27 NOTE — Telephone Encounter (Signed)
 okay

## 2023-08-27 DIAGNOSIS — G4733 Obstructive sleep apnea (adult) (pediatric): Secondary | ICD-10-CM | POA: Diagnosis not present

## 2023-08-28 ENCOUNTER — Other Ambulatory Visit: Payer: Self-pay | Admitting: Nurse Practitioner

## 2023-08-28 DIAGNOSIS — K219 Gastro-esophageal reflux disease without esophagitis: Secondary | ICD-10-CM

## 2023-09-09 DIAGNOSIS — Z0189 Encounter for other specified special examinations: Secondary | ICD-10-CM | POA: Diagnosis not present

## 2023-09-16 DIAGNOSIS — E785 Hyperlipidemia, unspecified: Secondary | ICD-10-CM | POA: Diagnosis not present

## 2023-09-16 DIAGNOSIS — Z043 Encounter for examination and observation following other accident: Secondary | ICD-10-CM | POA: Diagnosis not present

## 2023-09-16 DIAGNOSIS — E119 Type 2 diabetes mellitus without complications: Secondary | ICD-10-CM | POA: Diagnosis not present

## 2023-09-21 ENCOUNTER — Ambulatory Visit: Payer: BC Managed Care – PPO | Admitting: Nurse Practitioner

## 2023-09-21 VITALS — BP 122/80 | HR 65 | Temp 97.9°F | Ht 69.0 in | Wt 308.2 lb

## 2023-09-21 DIAGNOSIS — E1169 Type 2 diabetes mellitus with other specified complication: Secondary | ICD-10-CM

## 2023-09-21 DIAGNOSIS — Z7984 Long term (current) use of oral hypoglycemic drugs: Secondary | ICD-10-CM

## 2023-09-21 LAB — POCT GLYCOSYLATED HEMOGLOBIN (HGB A1C): Hemoglobin A1C: 6.7 % — AB (ref 4.0–5.6)

## 2023-09-21 NOTE — Assessment & Plan Note (Signed)
 Patient has worked on lifestyle modifications he has been moving more albeit with employment and reduced portion sizes.  A1c has reduced to 6.7%.  Will continue metformin  500 mg twice daily and him to continue with lifestyle modifications.

## 2023-09-21 NOTE — Patient Instructions (Signed)
 Nice to see you today Your A1C was 6.7%. we will make no changes to the medications Follow up with me in 3 months for you physical, sooner if you need me

## 2023-09-21 NOTE — Progress Notes (Signed)
   Established Patient Office Visit  Subjective   Patient ID: Mike Wilson, male    DOB: Aug 01, 1960  Age: 63 y.o. MRN: [id number]  Chief Complaint  Patient presents with   Diabetes    HPI   DM2: patient is currently maintained on metformin  500mg  BID. His last A1C was elevated at 7.3% and wanted a chance to work on lifestyle changes prior to making medication changes. He is here for follow up He does not check glucose at home Hypoglycemia: none States that he is eating less and he is working more. States that he is getting ready for his daughters wedding that will be in his back yard. The wedding is scheduled for July 4th   Review of Systems  Constitutional:  Negative for chills and fever.  Respiratory:  Negative for shortness of breath.   Cardiovascular:  Negative for chest pain.  Neurological:  Negative for headaches.      Objective:     BP 122/80   Pulse 65   Temp 97.9 F (36.6 C) (Oral)   Ht 5\' 9"  (1.753 m)   Wt (!) 308 lb 3.2 oz (139.8 kg)   SpO2 99%   BMI 45.51 kg/m  BP Readings from Last 3 Encounters:  09/21/23 122/80  06/24/23 126/88  12/22/22 (!) 142/100   Wt Readings from Last 3 Encounters:  09/21/23 (!) 308 lb 3.2 oz (139.8 kg)  06/24/23 (!) 309 lb 9.6 oz (140.4 kg)  12/22/22 (!) 315 lb 3.2 oz (143 kg)   SpO2 Readings from Last 3 Encounters:  09/21/23 99%  06/24/23 98%  12/22/22 98%      Physical Exam Vitals and nursing note reviewed.  Constitutional:      Appearance: Normal appearance.  Cardiovascular:     Rate and Rhythm: Normal rate and regular rhythm.     Heart sounds: Normal heart sounds.  Pulmonary:     Effort: Pulmonary effort is normal.     Breath sounds: Normal breath sounds.  Neurological:     Mental Status: He is alert.      Results for orders placed or performed in visit on 09/21/23  POCT glycosylated hemoglobin (Hb A1C)  Result Value Ref Range   Hemoglobin A1C 6.7 (A) 4.0 - 5.6 %   HbA1c POC (<> result, manual entry)      HbA1c, POC (prediabetic range)     HbA1c, POC (controlled diabetic range)        The 10-year ASCVD risk score (Arnett DK, et al., 2019) is: 18.3%    Assessment & Plan:   Problem List Items Addressed This Visit       Endocrine   Type 2 diabetes mellitus with other specified complication [facility]) - Primary   Patient has worked on lifestyle modifications he has been moving more albeit with employment and reduced portion sizes.  A1c has reduced to 6.7%.  Will continue metformin  500 mg twice daily and him to continue with lifestyle modifications.      Relevant Orders   POCT glycosylated hemoglobin (Hb A1C) (Completed)    Return in about 3 months (around 12/22/2023) for CPE and Labs.    Margarie Shay, NP

## 2023-11-23 ENCOUNTER — Other Ambulatory Visit: Payer: Self-pay | Admitting: Nurse Practitioner

## 2023-11-23 DIAGNOSIS — K219 Gastro-esophageal reflux disease without esophagitis: Secondary | ICD-10-CM

## 2023-11-24 ENCOUNTER — Other Ambulatory Visit: Payer: Self-pay | Admitting: Nurse Practitioner

## 2023-11-24 DIAGNOSIS — E1169 Type 2 diabetes mellitus with other specified complication: Secondary | ICD-10-CM

## 2023-12-09 DIAGNOSIS — G4733 Obstructive sleep apnea (adult) (pediatric): Secondary | ICD-10-CM | POA: Diagnosis not present

## 2023-12-24 ENCOUNTER — Other Ambulatory Visit

## 2023-12-31 ENCOUNTER — Encounter: Payer: Self-pay | Admitting: Nurse Practitioner

## 2023-12-31 ENCOUNTER — Ambulatory Visit (INDEPENDENT_AMBULATORY_CARE_PROVIDER_SITE_OTHER): Admitting: Nurse Practitioner

## 2023-12-31 ENCOUNTER — Ambulatory Visit: Payer: Self-pay | Admitting: Family

## 2023-12-31 VITALS — BP 130/86 | HR 71 | Temp 98.1°F | Ht 69.0 in | Wt 309.6 lb

## 2023-12-31 DIAGNOSIS — Z0000 Encounter for general adult medical examination without abnormal findings: Secondary | ICD-10-CM

## 2023-12-31 DIAGNOSIS — K219 Gastro-esophageal reflux disease without esophagitis: Secondary | ICD-10-CM | POA: Diagnosis not present

## 2023-12-31 DIAGNOSIS — Z1211 Encounter for screening for malignant neoplasm of colon: Secondary | ICD-10-CM

## 2023-12-31 DIAGNOSIS — Z125 Encounter for screening for malignant neoplasm of prostate: Secondary | ICD-10-CM

## 2023-12-31 DIAGNOSIS — E785 Hyperlipidemia, unspecified: Secondary | ICD-10-CM | POA: Diagnosis not present

## 2023-12-31 DIAGNOSIS — E1169 Type 2 diabetes mellitus with other specified complication: Secondary | ICD-10-CM

## 2023-12-31 DIAGNOSIS — I10 Essential (primary) hypertension: Secondary | ICD-10-CM

## 2023-12-31 DIAGNOSIS — Z7984 Long term (current) use of oral hypoglycemic drugs: Secondary | ICD-10-CM

## 2023-12-31 LAB — LIPID PANEL
Cholesterol: 152 mg/dL (ref 0–200)
HDL: 44.2 mg/dL (ref >39.00)
LDL Cholesterol: 85 mg/dL (ref 0–99)
NonHDL: 107.43
Total CHOL/HDL Ratio: 3
Triglycerides: 110 mg/dL (ref 0.0–149.0)
VLDL: 22 mg/dL (ref 0.0–40.0)

## 2023-12-31 LAB — CBC WITH DIFFERENTIAL/PLATELET
Basophils Absolute: 0 K/uL (ref 0.0–0.1)
Basophils Relative: 0.7 % (ref 0.0–3.0)
Eosinophils Absolute: 0.2 K/uL (ref 0.0–0.7)
Eosinophils Relative: 3 % (ref 0.0–5.0)
HCT: 41 % (ref 39.0–52.0)
Hemoglobin: 13.3 g/dL (ref 13.0–17.0)
Lymphocytes Relative: 19.5 % (ref 12.0–46.0)
Lymphs Abs: 1 K/uL (ref 0.7–4.0)
MCHC: 32.6 g/dL (ref 30.0–36.0)
MCV: 87.4 fl (ref 78.0–100.0)
Monocytes Absolute: 0.4 K/uL (ref 0.1–1.0)
Monocytes Relative: 7 % (ref 3.0–12.0)
Neutro Abs: 3.6 K/uL (ref 1.4–7.7)
Neutrophils Relative %: 69.8 % (ref 43.0–77.0)
Platelets: 203 K/uL (ref 150.0–400.0)
RBC: 4.69 Mil/uL (ref 4.22–5.81)
RDW: 14.4 % (ref 11.5–15.5)
WBC: 5.1 K/uL (ref 4.0–10.5)

## 2023-12-31 LAB — COMPREHENSIVE METABOLIC PANEL WITH GFR
ALT: 17 U/L (ref 0–53)
AST: 13 U/L (ref 0–37)
Albumin: 3.9 g/dL (ref 3.5–5.2)
Alkaline Phosphatase: 66 U/L (ref 39–117)
BUN: 14 mg/dL (ref 6–23)
CO2: 29 meq/L (ref 19–32)
Calcium: 8.9 mg/dL (ref 8.4–10.5)
Chloride: 104 meq/L (ref 96–112)
Creatinine, Ser: 0.73 mg/dL (ref 0.40–1.50)
GFR: 96.96 mL/min (ref >60.00)
Glucose, Bld: 126 mg/dL — ABNORMAL HIGH (ref 70–99)
Potassium: 4.2 meq/L (ref 3.5–5.1)
Sodium: 139 meq/L (ref 135–145)
Total Bilirubin: 0.5 mg/dL (ref 0.2–1.2)
Total Protein: 6.6 g/dL (ref 6.0–8.3)

## 2023-12-31 LAB — MICROALBUMIN / CREATININE URINE RATIO
Creatinine,U: 70.3 mg/dL
Microalb Creat Ratio: UNDETERMINED mg/g (ref 0.0–30.0)
Microalb, Ur: <0.7 mg/dL

## 2023-12-31 LAB — TSH: TSH: 2.36 u[IU]/mL (ref 0.35–5.50)

## 2023-12-31 LAB — POCT GLYCOSYLATED HEMOGLOBIN (HGB A1C): Hemoglobin A1C: 6.9 % — AB (ref 4.0–5.6)

## 2023-12-31 LAB — PSA: PSA: 0.66 ng/mL (ref 0.10–4.00)

## 2023-12-31 NOTE — Assessment & Plan Note (Signed)
 Discussed age-appropriate immunizations and screening exams.  Did review patient's personal, surgical, social, family histories.  Patient is up-to-date with all age-appropriate vaccinations he would like.  Patient declined all vaccines.  Patient was referred to GI for CRC screening as he is overdue.  PSA for prostate cancer screening today.  Patient was given information at discharge healthcare maintenance with anticipatory guidance.

## 2023-12-31 NOTE — Assessment & Plan Note (Signed)
 Patient currently maintained on losartan  50 mg daily.  Blood pressure controlled.  Patient does not check blood pressure at home continue medication as prescribed

## 2023-12-31 NOTE — Assessment & Plan Note (Signed)
 History of same currently maintained omeprazole  20 mg daily.  Continue

## 2023-12-31 NOTE — Assessment & Plan Note (Signed)
 Currently maintained on metformin  500 mg twice daily patient's A1c 6.9% today.  Goal below 7.  Continue medication as prescribed.  Continue working on lifestyle modifications

## 2023-12-31 NOTE — Patient Instructions (Signed)
 Nice to see you today A1C was 6.9% I will be in touch with the labs once I have them  Follow up with me in 6 months, sooner if you need me

## 2023-12-31 NOTE — Progress Notes (Signed)
 Established Patient Office Visit  Subjective   Patient ID: Mike Wilson, male    DOB: 1961-03-26  Age: 63 y.o. MRN: [id number]  Chief Complaint  Patient presents with   Annual Exam    HPI  DM2: Patient currently maintained on metformin  500 mg twice daily. He does not check his sugars at home but he belives he has supplies  HLD: Patient currently maintained on pravastatin  20 mg daily.  HTN: Patient currently maintained on losartan  50 mg daily. Does not check blood pressure at home. States that they do have a cuff at home  GERD: Patient currently maintained on omeprazole  20 mg daily  for complete physical and follow up of chronic conditions.  Immunizations: -Tetanus: Completed in 9 years ago -Influenza: Out of season -Shingles: Refused -Pneumonia: Refused  Diet: Fair diet. He is eating 3 meals a day. He will do some healthy snacks like nuts and fruit. He is drinking coffee, water and unsweet iced tea Exercise: No regular exercise. Employment.   Eye exam: Completes annually. glasses  Dental exam: PRN   Colonoscopy: Completed in 08/06/2017, repeat 5 years patient is overdue and was referred last year Lung Cancer Screening: NA  PSA: Due  Sleep: going to bed around 10 and will get up around 530-7. Feels rested and does snore. He does have a CPAP       Review of Systems  Constitutional:  Negative for chills and fever.  Respiratory:  Negative for shortness of breath (with exterion).   Cardiovascular:  Negative for chest pain and leg swelling.  Gastrointestinal:  Negative for abdominal pain, blood in stool, constipation, diarrhea, nausea and vomiting.       BM daily   Genitourinary:  Negative for dysuria and hematuria.  Neurological:  Negative for dizziness, tingling and headaches.  Psychiatric/Behavioral:  Negative for hallucinations and suicidal ideas.       Objective:     BP 130/86   Pulse 71   Temp 98.1 F (36.7 C) (Oral)   Ht 5' 9 (1.753 m)   Wt (!) 309  lb 9.6 oz (140.4 kg)   SpO2 96%   BMI 45.72 kg/m  BP Readings from Last 3 Encounters:  12/31/23 130/86  09/21/23 122/80  06/24/23 126/88   Wt Readings from Last 3 Encounters:  12/31/23 (!) 309 lb 9.6 oz (140.4 kg)  09/21/23 (!) 308 lb 3.2 oz (139.8 kg)  06/24/23 (!) 309 lb 9.6 oz (140.4 kg)   SpO2 Readings from Last 3 Encounters:  12/31/23 96%  09/21/23 99%  06/24/23 98%      Physical Exam Vitals and nursing note reviewed.  Constitutional:      Appearance: Normal appearance.  HENT:     Right Ear: Tympanic membrane, ear canal and external ear normal.     Left Ear: Tympanic membrane, ear canal and external ear normal.     Mouth/Throat:     Mouth: Mucous membranes are moist.     Pharynx: Oropharynx is clear.  Eyes:     Extraocular Movements: Extraocular movements intact.     Pupils: Pupils are equal, round, and reactive to light.  Cardiovascular:     Rate and Rhythm: Normal rate and regular rhythm.     Pulses: Normal pulses.     Heart sounds: Normal heart sounds.  Pulmonary:     Effort: Pulmonary effort is normal.     Breath sounds: Normal breath sounds.  Abdominal:     General: Bowel sounds are normal. There is no  distension.     Palpations: There is no mass.     Tenderness: There is no abdominal tenderness.     Hernia: No hernia is present.  Musculoskeletal:     Right lower leg: No edema.     Left lower leg: No edema.  Lymphadenopathy:     Cervical: No cervical adenopathy.  Skin:    General: Skin is warm.  Neurological:     General: No focal deficit present.     Mental Status: He is alert.     Deep Tendon Reflexes:     Reflex Scores:      Bicep reflexes are 2+ on the right side and 2+ on the left side.      Patellar reflexes are 2+ on the right side and 2+ on the left side.    Comments: Bilateral upper and lower extremity strength 5/5  Psychiatric:        Mood and Affect: Mood normal.        Behavior: Behavior normal.        Thought Content: Thought  content normal.        Judgment: Judgment normal.    Title   Diabetic Foot Exam - detailed Visual Foot Exam completed.: Yes  Is there a history of foot ulcer?: No Is there a foot ulcer now?: No Is there swelling?: No Is there elevated skin temperature?: No Is there abnormal foot shape?: No Is there a claw toe deformity?: No Are the toenails long?: No Are the toenails thick?: No Are the toenails ingrown?: No Is the skin thin, fragile, shiny and hairless?: No Pulse Foot Exam completed.: Yes   Right Posterior Tibialis: Diminished Left posterior Tibialis: Diminished   Right Dorsalis Pedis: Present Left Dorsalis Pedis: Present     Semmes-Weinstein Monofilament Test + means has sensation and - means no sensation      Image components are not supported.   Image components are not supported. Image components are not supported.  Tuning Fork Comments All 10 sites tested sensation intact       Results for orders placed or performed in visit on 12/31/23  POCT glycosylated hemoglobin (Hb A1C)  Result Value Ref Range   Hemoglobin A1C 6.9 (A) 4.0 - 5.6 %   HbA1c POC (<> result, manual entry)     HbA1c, POC (prediabetic range)     HbA1c, POC (controlled diabetic range)        The 10-year ASCVD risk score (Arnett DK, et al., 2019) is: 20.3%    Assessment & Plan:   Problem List Items Addressed This Visit       Cardiovascular and Mediastinum   Essential hypertension   Patient currently maintained on losartan  50 mg daily.  Blood pressure controlled.  Patient does not check blood pressure at home continue medication as prescribed      Relevant Orders   CBC with Differential/Platelet   Comprehensive metabolic panel with GFR   TSH   Microalbumin / creatinine urine ratio   Lipid panel     Digestive   Gastroesophageal reflux disease   History of same currently maintained omeprazole  20 mg daily.  Continue        Endocrine   Type 2 diabetes mellitus with other  specified complication (HCC)   Relevant Orders   POCT glycosylated hemoglobin (Hb A1C) (Completed)   CBC with Differential/Platelet   Comprehensive metabolic panel with GFR   Microalbumin / creatinine urine ratio   Lipid panel   Hyperlipidemia associated with  type 2 diabetes mellitus (HCC)   Currently maintained on metformin  500 mg twice daily patient's A1c 6.9% today.  Goal below 7.  Continue medication as prescribed.  Continue working on lifestyle modifications      Relevant Orders   Lipid panel     Other   Preventative health care - Primary   Discussed age-appropriate immunizations and screening exams.  Did review patient's personal, surgical, social, family histories.  Patient is up-to-date with all age-appropriate vaccinations he would like.  Patient declined all vaccines.  Patient was referred to GI for CRC screening as he is overdue.  PSA for prostate cancer screening today.  Patient was given information at discharge healthcare maintenance with anticipatory guidance.      Relevant Orders   CBC with Differential/Platelet   Comprehensive metabolic panel with GFR   Other Visit Diagnoses       Screening for colon cancer       Relevant Orders   Ambulatory referral to Gastroenterology     Screening for prostate cancer       Relevant Orders   PSA       Return in about 6 months (around 07/02/2024) for DM recheck.    Adina Crandall, NP

## 2024-02-22 ENCOUNTER — Ambulatory Visit: Payer: Self-pay

## 2024-02-22 NOTE — Telephone Encounter (Signed)
 FYI Only or Action Required?: FYI only for provider.  Patient was last seen in primary care on 12/31/2023 by Wendee Lynwood HERO, NP.  Called Nurse Triage reporting Pain.  Symptoms began a week ago.  Interventions attempted: Nothing.  Symptoms are: gradually worsening.  Triage Disposition: See PCP When Office is Open (Within 3 Days)  Patient/caregiver understands and will follow disposition?: Yes      injured shoulder, painful ongoing for a week,   Reason for Disposition  [1] MODERATE pain (e.g., interferes with normal activities) AND [2] present > 3 days  Answer Assessment - Initial Assessment Questions 1. ONSET: When did the pain start?     X week 2. LOCATION: Where is the pain located?     Right shoulder 3. PAIN: How bad is the pain? (Scale 1-10; or mild, moderate, severe)     2 to 8/10 4. WORK OR EXERCISE: Has there been any recent work or exercise that involved this part of the body?     na 5. CAUSE: What do you think is causing the shoulder pain?     Unsure: possible rotator cuff 6. OTHER SYMPTOMS: Do you have any other symptoms? (e.g., neck pain, swelling, rash, fever, numbness, weakness)     Stiffness, reduced ROM 7. PREGNANCY: Is there any chance you are pregnant? When was your last menstrual period?     Na  Worsening at night  Protocols used: Shoulder Pain-A-AH

## 2024-02-22 NOTE — Telephone Encounter (Signed)
 Will see patient then Agree with ER and UC precautions

## 2024-02-24 ENCOUNTER — Ambulatory Visit (INDEPENDENT_AMBULATORY_CARE_PROVIDER_SITE_OTHER)
Admission: RE | Admit: 2024-02-24 | Discharge: 2024-02-24 | Disposition: A | Source: Ambulatory Visit | Attending: Family Medicine | Admitting: Family Medicine

## 2024-02-24 ENCOUNTER — Ambulatory Visit: Admitting: Family Medicine

## 2024-02-24 ENCOUNTER — Ambulatory Visit: Payer: Self-pay | Admitting: Family Medicine

## 2024-02-24 ENCOUNTER — Encounter: Payer: Self-pay | Admitting: Family Medicine

## 2024-02-24 VITALS — BP 133/88 | HR 72 | Temp 97.8°F | Ht 69.0 in | Wt 315.2 lb

## 2024-02-24 DIAGNOSIS — M25511 Pain in right shoulder: Secondary | ICD-10-CM | POA: Insufficient documentation

## 2024-02-24 DIAGNOSIS — I10 Essential (primary) hypertension: Secondary | ICD-10-CM

## 2024-02-24 DIAGNOSIS — M79601 Pain in right arm: Secondary | ICD-10-CM | POA: Diagnosis not present

## 2024-02-24 DIAGNOSIS — K219 Gastro-esophageal reflux disease without esophagitis: Secondary | ICD-10-CM

## 2024-02-24 MED ORDER — OMEPRAZOLE 20 MG PO CPDR: 20.0000 mg | DELAYED_RELEASE_CAPSULE | Freq: Every day | ORAL | 0 refills | Status: DC

## 2024-02-24 NOTE — Assessment & Plan Note (Signed)
 Refilled omeprazole  once in pcp absence  Encouraged to try voltaren gel instead of oral nsaids if able

## 2024-02-24 NOTE — Assessment & Plan Note (Signed)
 BP: 133/88  Not quite at goal per guidelines Losartan  50 mg daily   Encouraged to try topical nsaid instead of oral  Recommended follow up with pcp

## 2024-02-24 NOTE — Assessment & Plan Note (Addendum)
 In the setting of heavy physical work but no trauma Anterior /dull and worse with abduction of arm  Tender over acromion, neg Hawking test/positive Neer and pain with external rotation Xray ordered noting mild for age glenohumeral degeneration (no fractures)   Encouraged use of cold compress  Rom exercise handout given  Encouraged trial of voltaren gel instead of oral nsaid   Plan sport med visit

## 2024-02-24 NOTE — Patient Instructions (Signed)
 Use ice on your shoulder whenever you can for 10 minutes   You can try voltaren gel over the counter   Do some range of motion exercises to prevent stiffness   Xray now  We will reach out with results and make a plan

## 2024-02-24 NOTE — Progress Notes (Signed)
 Subjective:    Patient ID: Mike Wilson, male    DOB: [birth year], 63 y.o.   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 CLINICAL HISTORY: 63 year old male with right shoulder pain and anterior pain when abducting arm past 90 degrees. FINDINGS: BONES AND JOINTS: Mild right glenohumeral joint space loss and subchondral sclerosis. No acute fracture or dislocation. The [facility] joint is unremarkable in appearance. Negative visible right ribs. SOFT TISSUES: No abnormal calcifications. Visualized lung is unremarkable. IMPRESSION: 1. No acute osseous abnormality. 2. Mild for age right glenohumeral degeneration. Electronically signed by: Helayne Hurst MD 02/24/2024 09:54 AM EDT RP Workstation: [id number]      Patient Active Problem List   Diagnosis Date Noted   Right shoulder pain 02/24/2024   Preventative health care 12/22/2022   Gastroesophageal reflux disease 12/18/2021   Essential hypertension 12/24/2020   Anosmia 08/05/2020   Type 2 diabetes mellitus with other specified complication (HCC) 08/05/2020   Hyperlipidemia associated with type 2  diabetes mellitus (HCC) 08/05/2020   Osteoarthritis of right knee 08/05/2020   Cerebral vascular disease 08/05/2020   Family history of melanoma 08/05/2020   History of COVID-19 02/02/2020   Adenomatous colon polyp 08/06/2017   Hypogonadism male 03/12/2017   Benign prostatic hyperplasia with lower urinary tract symptoms 03/12/2017   History of urethral stricture 03/12/2017   Past Medical History:  Diagnosis Date   Diabetes mellitus without complication (HCC)    Hyperlipidemia    Hypertension    Past Surgical History:  Procedure Laterality Date   APPENDECTOMY     COLONOSCOPY WITH PROPOFOL  N/A 08/06/2017   Procedure: COLONOSCOPY WITH PROPOFOL ;  Surgeon: Therisa Bi, MD;  Location: [facility] ENDOSCOPY;  Service: Gastroenterology;  Laterality: N/A;   TONSILLECTOMY  1969   VASECTOMY  1998   Social History   Tobacco Use   Smoking status: Never   Smokeless tobacco: Never  Vaping Use   Vaping status: Never Used  Substance Use Topics   Alcohol use: Not Currently   Drug use: No   Family History  Problem Relation Age of Onset   Diabetes Mother    Liver cancer Mother    Melanoma Father    Diabetes Sister    Prostate cancer Neg Hx    Bladder Cancer Neg Hx    Kidney cancer Neg Hx    No Known Allergies Current Outpatient Medications on File Prior to Visit  Medication Sig Dispense Refill   ibuprofen (ADVIL) 200 MG tablet Take 200 mg by mouth every 6 (six) hours as needed. 2 tabs prn     losartan  (COZAAR ) 50 MG tablet Take 1 tablet (50 mg total) by mouth daily. 90 tablet 3   metFORMIN  (GLUCOPHAGE ) 500 MG tablet Take 1 tablet (500 mg total) by mouth 2 (two) times daily with a meal. 180 tablet 3   pravastatin  (PRAVACHOL ) 40 MG tablet Take 1/2 (one-half) tablet by mouth once daily 45 tablet 0   triamcinolone  cream (KENALOG ) 0.1 % APPLY SPARINGLY TO THE AFFECTED AREA THREE TIMES DAILY     No current facility-administered medications on file prior to visit.    Review of Systems   Constitutional:  Negative for fatigue and fever.  Musculoskeletal:        Right shoulder pain   Neurological:  Negative for weakness and numbness.       Objective:   Physical Exam Constitutional:      General: He is not in acute distress.    Appearance: Normal appearance. He is obese. He is not ill-appearing or diaphoretic.  Cardiovascular:     Rate and Rhythm: Normal rate and regular rhythm.  Pulmonary:     Effort: Pulmonary effort is normal. No respiratory distress.     Breath sounds: Normal breath sounds.  Musculoskeletal:     Comments: Shoulder right  No deformity/swelling/warmth or erythema  No crepitus  No obvious effusion  Abduction - pain past 90 degrees Hawking test neg Neer test positive for anterior pain Internal rotation some discomfort but full External rotation limited by pain Tenderness anterior/acromion  Normal grip and hand dexterity     Skin:    General: Skin is warm and dry.     Findings: No erythema or rash.  Neurological:     Mental Status: He is alert.     Sensory: No sensory deficit.     Motor: No weakness.  Psychiatric:        Mood and Affect: Mood normal.           Assessment & Plan:   Problem List Items Addressed This Visit       Cardiovascular and Mediastinum   Essential hypertension   BP: 133/88  Not quite at goal per guidelines Losartan  50 mg daily   Encouraged to try topical nsaid instead of oral  Recommended follow up with pcp         Digestive   Gastroesophageal reflux disease   Refilled omeprazole  once in pcp absence  Encouraged to try voltaren gel instead of oral nsaids if able      Relevant Medications   omeprazole  (PRILOSEC) 20 MG capsule     Other   Right shoulder pain - Primary   In the setting of heavy physical work but no trauma Anterior /dull and worse with abduction of arm  Tender over acromion, neg Hawking test/positive Neer and pain with external rotation Xray ordered noting mild for age  glenohumeral degeneration (no fractures)   Encouraged use of cold compress  Rom exercise handout given  Encouraged trial of voltaren gel instead of oral nsaid   Plan sport med visit        Relevant Orders   DG Shoulder Right (Completed)

## 2024-02-25 ENCOUNTER — Telehealth: Payer: Self-pay | Admitting: *Deleted

## 2024-02-25 NOTE — Telephone Encounter (Signed)
 Pt had xrays of his shoulder and per Dr. Randeen, she wants pt to f/u with Dr. Watt for eval, please schedule an appt with Dr. Watt. Pt aware via mychart you all are calling

## 2024-03-01 ENCOUNTER — Other Ambulatory Visit: Payer: Self-pay | Admitting: Nurse Practitioner

## 2024-03-01 DIAGNOSIS — E1169 Type 2 diabetes mellitus with other specified complication: Secondary | ICD-10-CM

## 2024-03-06 ENCOUNTER — Other Ambulatory Visit: Payer: Self-pay | Admitting: Nurse Practitioner

## 2024-03-06 DIAGNOSIS — I10 Essential (primary) hypertension: Secondary | ICD-10-CM

## 2024-03-13 NOTE — Progress Notes (Signed)
 "    Mike Pha T. Talayeh Bruinsma, MD, CAQ Sports Medicine [facility] [address] [place], [address]  Phone: [phone]  FAX: [phone]  [name] - 63 y.o. male  MRN [id number]  Date of Birth: 1961/02/20  Date: 03/16/2024  PCP: Wendee Lynwood HERO, NP  Referral: Wendee Lynwood HERO, NP  Chief Complaint  Patient presents with   Shoulder Pain    Right   Subjective:   Mike Wilson is a 63 y.o. very pleasant male patient with Body mass index is 46.68 kg/m. who presents with the following:  Discussed the use of AI scribe software for clinical note transcription with the patient, who gave verbal consent to proceed.  This is a patient who I have seen previously about 5 years ago for some knee pain, he presents today with some ongoing shoulder issues.  He has been having some problems with his right shoulder.  On my review, the glenohumeral joint looks fairly well-preserved. History is significant for type 2 diabetes, as well.  Lab Results  Component Value Date   HGBA1C 6.9 (A) 12/31/2023     History of Present Illness Mike Wilson is a 63 year old male who presents with right shoulder pain.  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 The pain worsens by the end of the day, especially after being active on his property. He denies any specific injury but recalls tripping a couple of times in warehouses, which may have jarred his shoulder.  The pain persists despite performing exercises, impacting his ability to remain as active as he would like. The shoulder pain is more noticeable in the morning, depending on his sleep quality. No numbness, tingling, or weakness in the arm is reported.  He has a family history of arthritis, as his grandmother was in a wheelchair due to the condition for the last ten years  of her life. - On my independent review of the patient's radiographs, the glenohumeral joint space looks to be well-preserved.  He has diabetes and occasionally experiences knee pain when using stairs. He has previously undergone physical therapy for his knees, which was beneficial.    Review of Systems is noted in the HPI, as appropriate  Objective:   BP (!) 148/90   Pulse 81   Temp 98.4 F (36.9 C) (Temporal)   Ht 5' 9 (1.753 m)   Wt (!) 316 lb 2 oz (143.4 kg)   SpO2 98%   BMI 46.68 kg/m   GEN: No acute distress; alert,appropriate. PULM: Breathing comfortably in no respiratory distress PSYCH: Normally interactive.    Shoulder: R Inspection: No muscle wasting or winging Ecchymosis/edema: neg  AC joint, scapula, clavicle: NT Cervical spine: NT, full ROM Spurling's: neg Abduction: full, 5/5 Flexion: full, 5/5 IR, full, lift-off: 5/5 ER at neutral: full, 5/5 AC crossover: neg Neer: pos Hawkins: pos Drop Test: neg Jobe: pos Supraspinatus insertion: mild-mod T Bicipital groove: NT Speed's: neg Yergason's: neg Sulcus sign: neg Scapular dyskinesis: none C5-T1 intact  Neuro: Sensation intact Grip 5/5   Laboratory and Imaging Data:  Assessment and Plan:     ICD-10-CM   1. Tendinopathy of rotator cuff, right  M67.911     2. Acute pain of right shoulder  M25.511 triamcinolone  acetonide (KENALOG -40) injection 40 mg    3. Type 2 diabetes mellitus with  other specified complication, without long-term current use of insulin (HCC)  E11.69      Assessment & Plan Right shoulder impingement and rotator cuff tendinitis Chronic right shoulder pain likely due to impingement and rotator cuff tendinitis. No arthritis on x-ray. Pain worsens with activity, especially driving. - Administer shoulder injection to reduce inflammation and pain, localized to joint, less impact on blood sugar than oral steroids. - Initiate rotator cuff strengthening exercises.  Rotator cuff  strengthening and scapular stabilization from [facility].  Aspiration/Injection Procedure Note Darrol Brandenburg [birth year] Date of procedure: 03/16/2024  Procedure: Large Joint Aspiration / Injection of Shoulder, Intraarticular, R Indications: Pain  Procedure Details Verbal consent was obtained from the patient. Risks explained and contrasted with benefits and alternatives. Patient prepped with Chloraprep and Ethyl Chloride used for anesthesia. An intraarticular shoulder injection was performed using the posterior approach. The patient tolerated the procedure well and had decreased pain post injection. No complications. Injection: 4 cc of Lidocaine  1% and 1/2 mL Kenalog  40 mg. Needle: 21 gauge, 2 inch Medication: 1/2 cc of Kenalog  40 mg (equaling Kenalog  20 mg)  Aspiration/Injection Procedure Note Hussam Muniz 18-Jul-1960 Date of procedure: 03/16/2024  Procedure: Large Joint Aspiration / Injection of Shoulder, Subacromial, R Indications: Pain  Procedure Details Verbal consent was obtained from the patient. Risks, benefits, and alternatives were explained. Patient prepped with Chloraprep and Ethyl Chloride used for anesthesia. The subacromial space was injected using the posterior approach. The patient tolerated the procedure well and had decreased pain post injection. No complications. Injection: 4 cc of Lidocaine  1% and 1/2 mL of Kenalog  40 mg. Needle: 22 gauge, 1 1/2 inch Medication: 1/2 cc of Kenalog  40 mg (equaling Kenalog  20 mg)   Medication Management during today's office visit: Meds ordered this encounter  Medications   triamcinolone  acetonide (KENALOG -40) injection 40 mg   There are no discontinued medications.  Orders placed today for conditions managed today: No orders of the defined types were placed in this encounter.   Disposition: No follow-ups on file.  [employer] speech-to-text software was used for transcription in this dictation.  Possible transcriptional  errors can occur using Animal nutritionist.   Signed,  Jacques DASEN. Kamaal Cast, MD   Outpatient Encounter Medications as of 03/16/2024  Medication Sig   ibuprofen (ADVIL) 200 MG tablet Take 200 mg by mouth every 6 (six) hours as needed. 2 tabs prn   losartan  (COZAAR ) 50 MG tablet Take 1 tablet by mouth once daily   metFORMIN  (GLUCOPHAGE ) 500 MG tablet Take 1 tablet (500 mg total) by mouth 2 (two) times daily with a meal.   omeprazole  (PRILOSEC) 20 MG capsule Take 1 capsule (20 mg total) by mouth daily.   pravastatin  (PRAVACHOL ) 40 MG tablet Take 1/2 (one-half) tablet by mouth once daily   triamcinolone  cream (KENALOG ) 0.1 % APPLY SPARINGLY TO THE AFFECTED AREA THREE TIMES DAILY   [EXPIRED] triamcinolone  acetonide (KENALOG -40) injection 40 mg    No facility-administered encounter medications on file as of 03/16/2024.   "

## 2024-03-16 ENCOUNTER — Ambulatory Visit: Admitting: Family Medicine

## 2024-03-16 ENCOUNTER — Encounter: Payer: Self-pay | Admitting: Family Medicine

## 2024-03-16 VITALS — BP 148/90 | HR 81 | Temp 98.4°F | Ht 69.0 in | Wt 316.1 lb

## 2024-03-16 DIAGNOSIS — M25511 Pain in right shoulder: Secondary | ICD-10-CM

## 2024-03-16 DIAGNOSIS — M67911 Unspecified disorder of synovium and tendon, right shoulder: Secondary | ICD-10-CM | POA: Diagnosis not present

## 2024-03-16 DIAGNOSIS — E1169 Type 2 diabetes mellitus with other specified complication: Secondary | ICD-10-CM

## 2024-03-16 MED ORDER — TRIAMCINOLONE ACETONIDE 40 MG/ML IJ SUSP
40.0000 mg | Freq: Once | INTRAMUSCULAR | Status: AC
  Administered 2024-03-16: 40 mg via INTRA_ARTICULAR

## 2024-04-04 DIAGNOSIS — Z01818 Encounter for other preprocedural examination: Secondary | ICD-10-CM | POA: Diagnosis not present

## 2024-04-04 DIAGNOSIS — Z860100 Personal history of colon polyps, unspecified: Secondary | ICD-10-CM | POA: Diagnosis not present

## 2024-05-08 ENCOUNTER — Other Ambulatory Visit: Payer: Self-pay | Admitting: Nurse Practitioner

## 2024-05-08 DIAGNOSIS — E119 Type 2 diabetes mellitus without complications: Secondary | ICD-10-CM

## 2024-05-15 DIAGNOSIS — H903 Sensorineural hearing loss, bilateral: Secondary | ICD-10-CM | POA: Diagnosis not present

## 2024-05-22 ENCOUNTER — Encounter: Payer: Self-pay | Admitting: Gastroenterology

## 2024-05-22 ENCOUNTER — Ambulatory Visit
Admission: RE | Admit: 2024-05-22 | Discharge: 2024-05-22 | Disposition: A | Discharge status: Home / Self Care | Attending: Gastroenterology | Admitting: Gastroenterology

## 2024-05-22 ENCOUNTER — Encounter
Admission: RE | Disposition: A | Discharge status: Home / Self Care | Payer: Self-pay | Source: Home / Self Care | Attending: Gastroenterology

## 2024-05-22 ENCOUNTER — Other Ambulatory Visit: Payer: Self-pay

## 2024-05-22 ENCOUNTER — Ambulatory Visit: Admitting: Anesthesiology

## 2024-05-22 DIAGNOSIS — I10 Essential (primary) hypertension: Secondary | ICD-10-CM | POA: Diagnosis not present

## 2024-05-22 DIAGNOSIS — E66813 Obesity, class 3: Secondary | ICD-10-CM | POA: Diagnosis not present

## 2024-05-22 DIAGNOSIS — D123 Benign neoplasm of transverse colon: Secondary | ICD-10-CM | POA: Diagnosis not present

## 2024-05-22 DIAGNOSIS — K219 Gastro-esophageal reflux disease without esophagitis: Secondary | ICD-10-CM | POA: Diagnosis not present

## 2024-05-22 DIAGNOSIS — Z6841 Body Mass Index (BMI) 40.0 and over, adult: Secondary | ICD-10-CM | POA: Insufficient documentation

## 2024-05-22 DIAGNOSIS — E119 Type 2 diabetes mellitus without complications: Secondary | ICD-10-CM | POA: Insufficient documentation

## 2024-05-22 DIAGNOSIS — Z1211 Encounter for screening for malignant neoplasm of colon: Secondary | ICD-10-CM | POA: Diagnosis present

## 2024-05-22 DIAGNOSIS — Z7984 Long term (current) use of oral hypoglycemic drugs: Secondary | ICD-10-CM | POA: Insufficient documentation

## 2024-05-22 HISTORY — PX: POLYPECTOMY: SHX149

## 2024-05-22 HISTORY — PX: COLONOSCOPY: SHX5424

## 2024-05-22 LAB — GLUCOSE, CAPILLARY: Glucose-Capillary: 138 mg/dL — ABNORMAL HIGH (ref 70–99)

## 2024-05-22 SURGERY — COLONOSCOPY
Anesthesia: General

## 2024-05-22 MED ORDER — LIDOCAINE HCL (CARDIAC) PF 100 MG/5ML IV SOSY
PREFILLED_SYRINGE | INTRAVENOUS | Status: DC | PRN
  Administered 2024-05-22: 40 mg via INTRAVENOUS

## 2024-05-22 MED ORDER — SODIUM CHLORIDE 0.9 % IV SOLN: INTRAVENOUS | Status: DC

## 2024-05-22 MED ORDER — PROPOFOL 10 MG/ML IV BOLUS
INTRAVENOUS | Status: DC | PRN
  Administered 2024-05-22: 60 mg via INTRAVENOUS

## 2024-05-22 MED ORDER — PROPOFOL 500 MG/50ML IV EMUL
INTRAVENOUS | Status: DC | PRN
  Administered 2024-05-22: 125 ug/kg/min via INTRAVENOUS

## 2024-05-22 NOTE — Transfer of Care (Signed)
 Immediate Anesthesia Transfer of Care Note  Patient: Mike Wilson  Procedure(s) Performed: COLONOSCOPY POLYPECTOMY, INTESTINE  Patient Location: PACU  Anesthesia Type:General  Level of Consciousness: awake, alert , and oriented  Airway & Oxygen Therapy: Patient Spontanous Breathing  Post-op Assessment: Report given to RN and Post -op Vital signs reviewed and stable  Post vital signs: stable  Last Vitals:  Vitals Value Taken Time  BP 96/55 05/22/24 08:46  Temp 35.8 C 05/22/24 08:46  Pulse 71 05/22/24 08:46  Resp 18 05/22/24 08:46  SpO2 94 % 05/22/24 08:46    Last Pain:  Vitals:   05/22/24 0846  TempSrc: Tympanic  PainSc: Asleep         Complications: No notable events documented.

## 2024-05-22 NOTE — Anesthesia Preprocedure Evaluation (Signed)
"                                    Anesthesia Evaluation  Patient identified by MRN, date of birth, ID band Patient awake    Reviewed: Allergy & Precautions, NPO status , Patient's Chart, lab work & pertinent test results  Airway Mallampati: II  TM Distance: >3 FB Neck ROM: full    Dental  (+) Teeth Intact   Pulmonary neg pulmonary ROS   Pulmonary exam normal  + decreased breath sounds      Cardiovascular Exercise Tolerance: Good hypertension, Pt. on medications negative cardio ROS Normal cardiovascular exam Rhythm:Regular Rate:Normal     Neuro/Psych negative neurological ROS  negative psych ROS   GI/Hepatic negative GI ROS, Neg liver ROS,GERD  Medicated,,  Endo/Other  negative endocrine ROSdiabetes, Type 2, Oral Hypoglycemic Agents  Class 3 obesity  Renal/GU negative Renal ROS  negative genitourinary   Musculoskeletal   Abdominal  (+) + obese  Peds negative pediatric ROS (+)  Hematology negative hematology ROS (+)   Anesthesia Other Findings Past Medical History: No date: Diabetes mellitus without complication (HCC) No date: Hyperlipidemia No date: Hypertension  Past Surgical History: No date: APPENDECTOMY 08/06/2017: COLONOSCOPY WITH PROPOFOL ; N/A     Comment:  Procedure: COLONOSCOPY WITH PROPOFOL ;  Surgeon: Therisa Bi, MD;  Location: [facility] ENDOSCOPY;  Service:               Gastroenterology;  Laterality: N/A; 1969: TONSILLECTOMY 1998: VASECTOMY  BMI    Body Mass Index: 44.34 kg/m      Reproductive/Obstetrics negative OB ROS                              Anesthesia Physical Anesthesia Plan  ASA: 3  Anesthesia Plan: General   Post-op Pain Management:    Induction: Intravenous  PONV Risk Score and Plan: Propofol  infusion and TIVA  Airway Management Planned: Natural Airway and Nasal Cannula  Additional Equipment:   Intra-op Plan:   Post-operative Plan:   Informed Consent: I have  reviewed the patients History and Physical, chart, labs and discussed the procedure including the risks, benefits and alternatives for the proposed anesthesia with the patient or authorized representative who has indicated his/her understanding and acceptance.     Dental Advisory Given  Plan Discussed with: CRNA  Anesthesia Plan Comments:         Anesthesia Quick Evaluation  "

## 2024-05-22 NOTE — Anesthesia Postprocedure Evaluation (Signed)
"   Anesthesia Post Note  Patient: Mike Wilson  Procedure(s) Performed: COLONOSCOPY POLYPECTOMY, INTESTINE  Patient location during evaluation: PACU Anesthesia Type: General Level of consciousness: awake Pain management: pain level controlled Vital Signs Assessment: post-procedure vital signs reviewed and stable Cardiovascular status: stable Anesthetic complications: no   No notable events documented.   Last Vitals:  Vitals:   05/22/24 0856 05/22/24 0906  BP: 100/65 108/72  Pulse: 84 63  Resp: 19 19  Temp:    SpO2: 95% 99%    Last Pain:  Vitals:   05/22/24 0906  TempSrc:   PainSc: 0-No pain                 VAN STAVEREN,[name]      "

## 2024-05-22 NOTE — H&P (Signed)
 "  Ruel Kung , MD [address], [address], [place], [place], [address] Phone: [phone] Fax: [phone]  Primary Care Physician:  Wendee Lynwood HERO, NP   Pre-Procedure History & Physical: HPI:  Mike Wilson is a 64 y.o. male is here for an colonoscopy.   Past Medical History:  Diagnosis Date   Diabetes mellitus without complication (HCC)    Hyperlipidemia    Hypertension     Past Surgical History:  Procedure Laterality Date   APPENDECTOMY     COLONOSCOPY WITH PROPOFOL  N/A 08/06/2017   Procedure: COLONOSCOPY WITH PROPOFOL ;  Surgeon: Kung Ruel, MD;  Location: [facility] ENDOSCOPY;  Service: Gastroenterology;  Laterality: N/A;   TONSILLECTOMY  1969   VASECTOMY  1998    Prior to Admission medications  Medication Sig Start Date End Date Taking? Authorizing Provider  ibuprofen (ADVIL) 200 MG tablet Take 200 mg by mouth every 6 (six) hours as needed. 2 tabs prn    [provider]  losartan  (COZAAR ) 50 MG tablet Take 1 tablet by mouth once daily 03/06/24   Wendee Lynwood HERO, NP  metFORMIN  (GLUCOPHAGE ) 500 MG tablet TAKE 1 TABLET BY MOUTH TWICE DAILY WITH A MEAL 05/08/24   Wendee Lynwood HERO, NP  omeprazole  (PRILOSEC) 20 MG capsule Take 1 capsule (20 mg total) by mouth daily. 02/24/24   Tower, Laine LABOR, MD  pravastatin  (PRAVACHOL ) 40 MG tablet Take 1/2 (one-half) tablet by mouth once daily 03/01/24   Wendee Lynwood HERO, NP  triamcinolone  cream (KENALOG ) 0.1 % APPLY SPARINGLY TO THE AFFECTED AREA THREE TIMES DAILY 09/08/22   [provider]    Allergies as of 04/05/2024   (No Known Allergies)    Family History  Problem Relation Age of Onset   Diabetes Mother    Liver cancer Mother    Melanoma Father    Diabetes Sister    Prostate cancer Neg Hx    Bladder Cancer Neg Hx    Kidney cancer Neg Hx     Social History   Socioeconomic History   Marital status: Married    Spouse name: Mliss   Number of children: 4   Years of education: some grad school   Highest education  level: Not on file  Occupational History   Not on file  Tobacco Use   Smoking status: Never   Smokeless tobacco: Never  Vaping Use   Vaping status: Never Used  Substance and Sexual Activity   Alcohol use: Not Currently   Drug use: No   Sexual activity: Yes    Partners: Female    Birth control/protection: Surgical  Other Topics Concern   Not on file  Social History Narrative   08/05/20   From: [place]  originally, moved for wife's work   Living: with wife, Mliss (1990) and 3 adult children   Work: truck hospital doctor, for [employer]      Family: 4 children - Powell, Harlene Chew, and Dorn       Enjoys: hiking, reading, video game w/ daughter      Exercise: here and there, finishing a remodel, yardwork   Diet: trading off cooking with children      Safety   Seat belts: Yes    Guns: decline   Safe in relationships: Yes    Social Drivers of Health   Tobacco Use: Low Risk (05/22/2024)   Patient History    Smoking Tobacco Use: Never    Smokeless Tobacco Use: Never    Passive Exposure: Not on file  Financial Resource Strain: Not on file  Food Insecurity: Not on file  Transportation Needs: Not on file  Physical Activity: Not on file  Stress: Not on file  Social Connections: Not on file  Intimate Partner Violence: Not on file  Depression (PHQ2-9): Low Risk (02/24/2024)   Depression (PHQ2-9)    PHQ-2 Score: 0  Alcohol Screen: Not on file  Housing: Unknown (04/04/2024)   Received from [facility]   Epic    Unable to Pay for Housing in the Last Year: Not on file    Number of Times Moved in the Last Year: Not on file    At any time in the past 12 months, were you homeless or living in a shelter (including now)?: No  Utilities: Not on file  Health Literacy: Not on file    Review of Systems: See HPI, otherwise negative ROS  Physical Exam: BP (!) 152/101   Pulse 72   Temp (!) 96.3 F (35.7 C) (Temporal)   Resp 18   Ht 5' 10 (1.778 m)   Wt (!)  140.2 kg   SpO2 96%   BMI 44.34 kg/m  General:   Alert,  pleasant and cooperative in NAD Head:  Normocephalic and atraumatic. Neck:  Supple; no masses or thyromegaly. Lungs:  Clear throughout to auscultation, normal respiratory effort.    Heart:  +S1, +S2, Regular rate and rhythm, No edema. Abdomen:  Soft, nontender and nondistended. Normal bowel sounds, without guarding, and without rebound.   Neurologic:  Alert and  oriented x4;  grossly normal neurologically.  Impression/Plan: Mike Wilson is here for an colonoscopy to be performed for surveillance due to prior history of colon polyps   Risks, benefits, limitations, and alternatives regarding  colonoscopy have been reviewed with the patient.  Questions have been answered.  All parties agreeable.   Ruel Kung, MD  05/22/2024, 8:16 AM  "

## 2024-05-22 NOTE — Op Note (Signed)
 [facility] Patient Name: Mike Wilson Procedure Date: 05/22/2024 7:44 AM MRN: [id number] Account #: [id number] Date of Birth: [birth year] Admit Type: Outpatient Age: 64 Room: [facility] ENDO ROOM 1 Gender: Male Note Status: Finalized Instrument Name: Colon Scope [id number] Procedure:             Colonoscopy Indications:           Surveillance: Personal history of adenomatous polyps                         on last colonoscopy 5 years ago, Last colonoscopy:                         March 2019 Providers:             Ruel Kung MD, MD Referring MD:          Ruel Kung MD, MD (Referring MD), Lynwood HERO. Wendee                         (Referring MD) Medicines:             Monitored Anesthesia Care Complications:         No immediate complications. Procedure:             Pre-Anesthesia Assessment:                        - Prior to the procedure, a History and Physical was                         performed, and patient medications, allergies and                         sensitivities were reviewed. The patient's tolerance                         of previous anesthesia was reviewed.                        - The risks and benefits of the procedure and the                         sedation options and risks were discussed with the                         patient. All questions were answered and informed                         consent was obtained.                        - ASA Grade Assessment: II - A patient with mild                         systemic disease.                        After obtaining informed consent, the colonoscope was                         passed under direct vision.  Throughout the procedure,                         the patient's blood pressure, pulse, and oxygen                         saturations were monitored continuously. The was                         introduced through the anus and advanced to the the                         cecum, identified by  the appendiceal orifice. The                         colonoscopy was performed with ease. The patient                         tolerated the procedure well. The quality of the bowel                         preparation was excellent. The ileocecal valve,                         appendiceal orifice, and rectum were photographed. Findings:      Two sessile polyps were found in the transverse colon. The polyps were 2       to 3 mm in size. These polyps were removed with a jumbo cold forceps.       Resection and retrieval were complete.      A 5 mm polyp was found in the transverse colon. The polyp was sessile.       The polyp was removed with a cold snare. Resection and retrieval were       complete.      The exam was otherwise without abnormality on direct and retroflexion       views. Impression:            - Two 2 to 3 mm polyps in the transverse colon,                         removed with a jumbo cold forceps. Resected and                         retrieved.                        - One 5 mm polyp in the transverse colon, removed with                         a cold snare. Resected and retrieved.                        - The examination was otherwise normal on direct and                         retroflexion views. Recommendation:        - Discharge patient to home (with escort).                        -  Resume previous diet.                        - Continue present medications.                        - Await pathology results.                        - Repeat colonoscopy for surveillance based on                         pathology results. Procedure Code(s):     --- Professional ---                        [phone], Colonoscopy, flexible; with removal of                         tumor(s), polyp(s), or other lesion(s) by snare                         technique                        [id number], 59, Colonoscopy, flexible; with biopsy, single                         or multiple Diagnosis Code(s):      --- Professional ---                        D12.3, Benign neoplasm of transverse colon (hepatic                         flexure or splenic flexure)                        Z86.010, Personal history of colonic polyps CPT copyright 2022 American Medical Association. All rights reserved. The codes documented in this report are preliminary and upon coder review may  be revised to meet current compliance requirements. Ruel Kung, MD Ruel Kung MD, MD 05/22/2024 8:45:32 AM This report has been signed electronically. Number of Addenda: 0 Note Initiated On: 05/22/2024 7:44 AM Scope Withdrawal Time: 0 hours 12 minutes 59 seconds  Total Procedure Duration: 0 hours 18 minutes 24 seconds  Estimated Blood Loss:  Estimated blood loss: none.      [facility]

## 2024-05-23 DIAGNOSIS — K219 Gastro-esophageal reflux disease without esophagitis: Secondary | ICD-10-CM

## 2024-05-23 LAB — SURGICAL PATHOLOGY

## 2024-05-24 ENCOUNTER — Ambulatory Visit: Payer: Self-pay | Admitting: Gastroenterology

## 2024-06-05 ENCOUNTER — Other Ambulatory Visit

## 2024-06-05 ENCOUNTER — Telehealth: Payer: Self-pay

## 2024-06-05 DIAGNOSIS — E1169 Type 2 diabetes mellitus with other specified complication: Secondary | ICD-10-CM

## 2024-06-05 NOTE — Telephone Encounter (Signed)
 I assume he means an A1C. I have placed the order to have it drawn

## 2024-06-05 NOTE — Telephone Encounter (Signed)
 Copied from CRM [id number]. 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    CB# [phone]

## 2024-06-05 NOTE — Addendum Note (Signed)
 Addended by: WENDEE LYNWOOD HERO on: 06/05/2024 01:41 PM   Modules accepted: Orders

## 2024-06-05 NOTE — Telephone Encounter (Signed)
 Called pt to have A1C labs drawn.  Pt states he will come in today at 3:45pm

## 2024-06-06 LAB — HEMOGLOBIN A1C: Hgb A1c MFr Bld: 7.2 % — ABNORMAL HIGH (ref 4.6–6.5)

## 2024-06-07 ENCOUNTER — Ambulatory Visit: Payer: Self-pay | Admitting: Nurse Practitioner

## 2024-07-04 ENCOUNTER — Ambulatory Visit: Admitting: Nurse Practitioner
